# Patient Record
Sex: Female | Born: 1945 | Race: White | Hispanic: No | Marital: Married | Smoking: Never smoker
Health system: Southern US, Community
[De-identification: ages and names within clinical notes are randomized; demographics above are authoritative.]

## PROBLEM LIST (undated history)

## (undated) DIAGNOSIS — E079 Disorder of thyroid, unspecified: Secondary | ICD-10-CM

## (undated) DIAGNOSIS — Z8639 Personal history of other endocrine, nutritional and metabolic disease: Principal | ICD-10-CM

## (undated) DIAGNOSIS — E782 Mixed hyperlipidemia: Secondary | ICD-10-CM

## (undated) DIAGNOSIS — K219 Gastro-esophageal reflux disease without esophagitis: Secondary | ICD-10-CM

## (undated) DIAGNOSIS — E559 Vitamin D deficiency, unspecified: Secondary | ICD-10-CM

## (undated) DIAGNOSIS — Z8619 Personal history of other infectious and parasitic diseases: Secondary | ICD-10-CM

## (undated) HISTORY — DX: Mixed hyperlipidemia: E78.2

## (undated) HISTORY — DX: Disorder of thyroid, unspecified: E07.9

## (undated) HISTORY — DX: Gastro-esophageal reflux disease without esophagitis: K21.9

## (undated) HISTORY — DX: Personal history of other infectious and parasitic diseases: Z86.19

## (undated) HISTORY — PX: THYROIDECTOMY, PARTIAL: SHX18

## (undated) HISTORY — DX: Vitamin D deficiency, unspecified: E55.9

## (undated) HISTORY — DX: Personal history of other endocrine, nutritional and metabolic disease: Z86.39

## (undated) HISTORY — PX: TUBAL LIGATION: SHX77

---

## 1999-09-22 ENCOUNTER — Encounter: Payer: Self-pay | Admitting: Family Medicine

## 1999-09-22 ENCOUNTER — Encounter: Admission: RE | Admit: 1999-09-22 | Discharge: 1999-09-22 | Payer: Self-pay | Admitting: Family Medicine

## 2000-01-19 ENCOUNTER — Encounter: Payer: Self-pay | Admitting: Family Medicine

## 2000-01-19 ENCOUNTER — Encounter: Admission: RE | Admit: 2000-01-19 | Discharge: 2000-01-19 | Payer: Self-pay | Admitting: Family Medicine

## 2000-03-22 ENCOUNTER — Other Ambulatory Visit: Admission: RE | Admit: 2000-03-22 | Discharge: 2000-03-22 | Payer: Self-pay | Admitting: Obstetrics & Gynecology

## 2001-07-30 ENCOUNTER — Other Ambulatory Visit: Admission: RE | Admit: 2001-07-30 | Discharge: 2001-07-30 | Payer: Self-pay | Admitting: Obstetrics & Gynecology

## 2002-11-04 ENCOUNTER — Other Ambulatory Visit: Admission: RE | Admit: 2002-11-04 | Discharge: 2002-11-04 | Payer: Self-pay | Admitting: Obstetrics & Gynecology

## 2003-05-17 ENCOUNTER — Ambulatory Visit (HOSPITAL_COMMUNITY): Admission: RE | Admit: 2003-05-17 | Discharge: 2003-05-17 | Payer: Self-pay | Admitting: Gastroenterology

## 2003-11-08 ENCOUNTER — Other Ambulatory Visit: Admission: RE | Admit: 2003-11-08 | Discharge: 2003-11-08 | Payer: Self-pay | Admitting: Obstetrics & Gynecology

## 2004-12-22 ENCOUNTER — Other Ambulatory Visit: Admission: RE | Admit: 2004-12-22 | Discharge: 2004-12-22 | Payer: Self-pay | Admitting: Obstetrics & Gynecology

## 2005-10-31 ENCOUNTER — Ambulatory Visit: Payer: Self-pay | Admitting: Internal Medicine

## 2005-11-07 ENCOUNTER — Ambulatory Visit: Payer: Self-pay | Admitting: Internal Medicine

## 2006-01-03 ENCOUNTER — Ambulatory Visit: Payer: Self-pay | Admitting: Internal Medicine

## 2006-03-18 ENCOUNTER — Ambulatory Visit: Payer: Self-pay | Admitting: Internal Medicine

## 2007-12-30 ENCOUNTER — Encounter: Payer: Self-pay | Admitting: *Deleted

## 2007-12-30 DIAGNOSIS — E049 Nontoxic goiter, unspecified: Secondary | ICD-10-CM

## 2007-12-30 DIAGNOSIS — K219 Gastro-esophageal reflux disease without esophagitis: Secondary | ICD-10-CM

## 2007-12-30 DIAGNOSIS — G4700 Insomnia, unspecified: Secondary | ICD-10-CM | POA: Insufficient documentation

## 2007-12-30 HISTORY — DX: Nontoxic goiter, unspecified: E04.9

## 2013-11-05 DIAGNOSIS — Z1211 Encounter for screening for malignant neoplasm of colon: Secondary | ICD-10-CM | POA: Diagnosis not present

## 2013-11-05 DIAGNOSIS — Z800 Family history of malignant neoplasm of digestive organs: Secondary | ICD-10-CM | POA: Diagnosis not present

## 2014-07-15 ENCOUNTER — Telehealth: Payer: Self-pay | Admitting: *Deleted

## 2014-07-15 ENCOUNTER — Ambulatory Visit: Payer: Self-pay | Admitting: Family Medicine

## 2014-07-15 DIAGNOSIS — Z0289 Encounter for other administrative examinations: Secondary | ICD-10-CM

## 2014-07-15 NOTE — Telephone Encounter (Signed)
Pt did not show for appointment 07/15/2014 at 1:30pm to establish care

## 2014-12-14 DIAGNOSIS — Z124 Encounter for screening for malignant neoplasm of cervix: Secondary | ICD-10-CM | POA: Diagnosis not present

## 2014-12-14 DIAGNOSIS — Z1231 Encounter for screening mammogram for malignant neoplasm of breast: Secondary | ICD-10-CM | POA: Diagnosis not present

## 2015-08-24 ENCOUNTER — Telehealth: Payer: Self-pay

## 2015-08-24 NOTE — Telephone Encounter (Signed)
Pre Visit Call Completed. 

## 2015-08-29 ENCOUNTER — Encounter: Payer: Self-pay | Admitting: Family Medicine

## 2015-08-29 ENCOUNTER — Ambulatory Visit (INDEPENDENT_AMBULATORY_CARE_PROVIDER_SITE_OTHER): Payer: Medicare Other | Admitting: Family Medicine

## 2015-08-29 VITALS — BP 114/76 | HR 74 | Temp 98.0°F | Ht 67.5 in | Wt 193.3 lb

## 2015-08-29 DIAGNOSIS — E782 Mixed hyperlipidemia: Secondary | ICD-10-CM | POA: Diagnosis not present

## 2015-08-29 DIAGNOSIS — Z8639 Personal history of other endocrine, nutritional and metabolic disease: Secondary | ICD-10-CM

## 2015-08-29 DIAGNOSIS — K219 Gastro-esophageal reflux disease without esophagitis: Secondary | ICD-10-CM | POA: Diagnosis not present

## 2015-08-29 DIAGNOSIS — G4700 Insomnia, unspecified: Secondary | ICD-10-CM | POA: Diagnosis not present

## 2015-08-29 DIAGNOSIS — Z0000 Encounter for general adult medical examination without abnormal findings: Secondary | ICD-10-CM

## 2015-08-29 DIAGNOSIS — Z8619 Personal history of other infectious and parasitic diseases: Secondary | ICD-10-CM | POA: Diagnosis not present

## 2015-08-29 DIAGNOSIS — E559 Vitamin D deficiency, unspecified: Secondary | ICD-10-CM

## 2015-08-29 HISTORY — DX: Personal history of other endocrine, nutritional and metabolic disease: Z86.39

## 2015-08-29 NOTE — Progress Notes (Signed)
Subjective:    Patient ID: Shirley Bean, female    DOB: 10-15-1945, 69 y.o.   MRN: [id number]  Chief Complaint  Patient presents with  . Establish Care    HPI Patient is in today for new patient appointment. She is in today to establish care and feeling fairly well at this time. She does describe herself as having a week immune system and questions whether goes back to her mononucleosis infection years ago. She describes her self is getting colds and sore throats frequently feeling tired frequently. She does not have any concerning symptoms today. She has been having trouble with heartburn although if she watches her diet she only has to take famotidine infrequently. She also has a history of hyperlipidemia, goiter and vitamin D deficiency. Denies CP/palp/SOB/HA/congestion/fevers/GI or GU c/o. Taking meds as prescribed  Past Medical History  Diagnosis Date  . GERD (gastroesophageal reflux disease)   . Thyroid disease     Past Surgical History  Procedure Laterality Date  . Tubal ligation    . Thyroidectomy, partial      Left    Family History  Problem Relation Age of Onset  . Cancer Mother     Colerectal  . Cancer Father     prostate and kidney    Social History   Social History  . Marital Status: Married    Spouse Name: N/A  . Number of Children: N/A  . Years of Education: N/A   Occupational History  . retired    Social History Main Topics  . Smoking status: Never Smoker   . Smokeless tobacco: Not on file  . Alcohol Use: 0.0 oz/week    0 Standard drinks or equivalent per week  . Drug Use: No  . Sexual Activity: Not on file   Other Topics Concern  . Not on file   Social History Narrative    No outpatient prescriptions prior to visit.   No facility-administered medications prior to visit.    Allergies  Allergen Reactions  . Stadol [Butorphanol] Nausea And Vomiting  . Sulfonamide Derivatives     Review of Systems  Constitutional: Positive for  malaise/fatigue. Negative for fever and chills.  HENT: Negative for congestion and hearing loss.   Eyes: Negative for discharge.  Respiratory: Negative for cough, sputum production and shortness of breath.   Cardiovascular: Negative for chest pain, palpitations and leg swelling.  Gastrointestinal: Positive for heartburn. Negative for nausea, vomiting, abdominal pain, diarrhea, constipation and blood in stool.  Genitourinary: Negative for dysuria, urgency, frequency and hematuria.  Musculoskeletal: Positive for myalgias. Negative for back pain and falls.  Skin: Negative for rash.  Neurological: Negative for dizziness, sensory change, loss of consciousness, weakness and headaches.  Endo/Heme/Allergies: Negative for environmental allergies. Does not bruise/bleed easily.  Psychiatric/Behavioral: Negative for depression and suicidal ideas. The patient has insomnia. The patient is not nervous/anxious.        Objective:    Physical Exam  Constitutional: She is oriented to person, place, and time. She appears well-developed and well-nourished. No distress.  HENT:  Head: Normocephalic and atraumatic.  Eyes: Conjunctivae are normal.  Neck: Neck supple. No thyromegaly present.  Cardiovascular: Normal rate, regular rhythm and normal heart sounds.   No murmur heard. Pulmonary/Chest: Effort normal and breath sounds normal. No respiratory distress.  Abdominal: Soft. Bowel sounds are normal. She exhibits no distension and no mass. There is no tenderness.  Musculoskeletal: She exhibits no edema.  Lymphadenopathy:    She has no cervical  adenopathy.  Neurological: She is alert and oriented to person, place, and time.  Skin: Skin is warm and dry.  Psychiatric: She has a normal mood and affect. Her behavior is normal.    BP 114/76 mmHg  Pulse 74  Temp(Src) 98 F (36.7 C) (Oral)  Ht 5' 7.5" (1.715 m)  Wt 193 lb 5 oz (87.686 kg)  BMI 29.81 kg/m2  SpO2 94% Wt Readings from Last 3 Encounters:    08/29/15 193 lb 5 oz (87.686 kg)  03/18/06 204 lb (92.534 kg)     No results found for: WBC, HGB, HCT, PLT, GLUCOSE, CHOL, TRIG, HDL, LDLDIRECT, LDLCALC, ALT, AST, NA, K, CL, CREATININE, BUN, CO2, TSH, PSA, INR, GLUF, HGBA1C, MICROALBUR      Assessment & Plan:   Problem List Items Addressed This Visit    None      I am having Ms. Wille maintain her multivitamin.  Meds ordered this encounter  Medications  . Multiple Vitamin (MULTIVITAMIN) tablet    Sig: Take 1 tablet by mouth daily.     Penni Homans, MD

## 2015-08-29 NOTE — Patient Instructions (Addendum)
[place], liquid, caps and gummies NOW 10 strain probiotic 1 cap daily Zinc 50 mg daily  Preventive Care for Adults, Female A healthy lifestyle and preventive care can promote health and wellness. Preventive health guidelines for women include the following key practices.  A routine yearly physical is a good way to check with your health care provider about your health and preventive screening. It is a chance to share any concerns and updates on your health and to receive a thorough exam.  Visit your dentist for a routine exam and preventive care every 6 months. Brush your teeth twice a day and floss once a day. Good oral hygiene prevents tooth decay and gum disease.  The frequency of eye exams is based on your age, health, family medical history, use of contact lenses, and other factors. Follow your health care provider's recommendations for frequency of eye exams.  Eat a healthy diet. Foods like vegetables, fruits, whole grains, low-fat dairy products, and lean protein foods contain the nutrients you need without too many calories. Decrease your intake of foods high in solid fats, added sugars, and salt. Eat the right amount of calories for you.Get information about a proper diet from your health care provider, if necessary.  Regular physical exercise is one of the most important things you can do for your health. Most adults should get at least 150 minutes of moderate-intensity exercise (any activity that increases your heart rate and causes you to sweat) each week. In addition, most adults need muscle-strengthening exercises on 2 or more days a week.  Maintain a healthy weight. The body mass index (BMI) is a screening tool to identify possible weight problems. It provides an estimate of body fat based on height and weight. Your health care provider can find your BMI and can help you achieve or maintain a healthy weight.For adults 20 years and older:  A BMI below 18.5 is  considered underweight.  A BMI of 18.5 to 24.9 is normal.  A BMI of 25 to 29.9 is considered overweight.  A BMI of 30 and above is considered obese.  Maintain normal blood lipids and cholesterol levels by exercising and minimizing your intake of saturated fat. Eat a balanced diet with plenty of fruit and vegetables. Blood tests for lipids and cholesterol should begin at age 6 and be repeated every 5 years. If your lipid or cholesterol levels are high, you are over 50, or you are at high risk for heart disease, you may need your cholesterol levels checked more frequently.Ongoing high lipid and cholesterol levels should be treated with medicines if diet and exercise are not working.  If you smoke, find out from your health care provider how to quit. If you do not use tobacco, do not start.  Lung cancer screening is recommended for adults aged 41-80 years who are at high risk for developing lung cancer because of a history of smoking. A yearly low-dose CT scan of the lungs is recommended for people who have at least a 30-pack-year history of smoking and are a current smoker or have quit within the past 15 years. A pack year of smoking is smoking an average of 1 pack of cigarettes a day for 1 year (for example: 1 pack a day for 30 years or 2 packs a day for 15 years). Yearly screening should continue until the smoker has stopped smoking for at least 15 years. Yearly screening should be stopped for people who develop a health problem that would  prevent them from having lung cancer treatment.  If you are pregnant, do not drink alcohol. If you are breastfeeding, be very cautious about drinking alcohol. If you are not pregnant and choose to drink alcohol, do not have more than 1 drink per day. One drink is considered to be 12 ounces (355 mL) of beer, 5 ounces (148 mL) of wine, or 1.5 ounces (44 mL) of liquor.  Avoid use of street drugs. Do not share needles with anyone. Ask for help if you need support or  instructions about stopping the use of drugs.  High blood pressure causes heart disease and increases the risk of stroke. Your blood pressure should be checked at least every 1 to 2 years. Ongoing high blood pressure should be treated with medicines if weight loss and exercise do not work.  If you are 17-30 years old, ask your health care provider if you should take aspirin to prevent strokes.  Diabetes screening is done by taking a blood sample to check your blood glucose level after you have not eaten for a certain period of time (fasting). If you are not overweight and you do not have risk factors for diabetes, you should be screened once every 3 years starting at age 83. If you are overweight or obese and you are 28-71 years of age, you should be screened for diabetes every year as part of your cardiovascular risk assessment.  Breast cancer screening is essential preventive care for women. You should practice "breast self-awareness." This means understanding the normal appearance and feel of your breasts and may include breast self-examination. Any changes detected, no matter how small, should be reported to a health care provider. Women in their 10s and 30s should have a clinical breast exam (CBE) by a health care provider as part of a regular health exam every 1 to 3 years. After age 23, women should have a CBE every year. Starting at age 103, women should consider having a mammogram (breast X-ray test) every year. Women who have a family history of breast cancer should talk to their health care provider about genetic screening. Women at a high risk of breast cancer should talk to their health care providers about having an MRI and a mammogram every year.  Breast cancer gene (BRCA)-related cancer risk assessment is recommended for women who have family members with BRCA-related cancers. BRCA-related cancers include breast, ovarian, tubal, and peritoneal cancers. Having family members with these  cancers may be associated with an increased risk for harmful changes (mutations) in the breast cancer genes BRCA1 and BRCA2. Results of the assessment will determine the need for genetic counseling and BRCA1 and BRCA2 testing.  Your health care provider may recommend that you be screened regularly for cancer of the pelvic organs (ovaries, uterus, and vagina). This screening involves a pelvic examination, including checking for microscopic changes to the surface of your cervix (Pap test). You may be encouraged to have this screening done every 3 years, beginning at age 28.  For women ages 41-65, health care providers may recommend pelvic exams and Pap testing every 3 years, or they may recommend the Pap and pelvic exam, combined with testing for human papilloma virus (HPV), every 5 years. Some types of HPV increase your risk of cervical cancer. Testing for HPV may also be done on women of any age with unclear Pap test results.  Other health care providers may not recommend any screening for nonpregnant women who are considered low risk for pelvic cancer  and who do not have symptoms. Ask your health care provider if a screening pelvic exam is right for you.  If you have had past treatment for cervical cancer or a condition that could lead to cancer, you need Pap tests and screening for cancer for at least 20 years after your treatment. If Pap tests have been discontinued, your risk factors (such as having a new sexual partner) need to be reassessed to determine if screening should resume. Some women have medical problems that increase the chance of getting cervical cancer. In these cases, your health care provider may recommend more frequent screening and Pap tests.  Colorectal cancer can be detected and often prevented. Most routine colorectal cancer screening begins at the age of 38 years and continues through age 79 years. However, your health care provider may recommend screening at an earlier age if you  have risk factors for colon cancer. On a yearly basis, your health care provider may provide home test kits to check for hidden blood in the stool. Use of a small camera at the end of a tube, to directly examine the colon (sigmoidoscopy or colonoscopy), can detect the earliest forms of colorectal cancer. Talk to your health care provider about this at age 41, when routine screening begins. Direct exam of the colon should be repeated every 5-10 years through age 45 years, unless early forms of precancerous polyps or small growths are found.  People who are at an increased risk for hepatitis B should be screened for this virus. You are considered at high risk for hepatitis B if:  You were born in a country where hepatitis B occurs often. Talk with your health care provider about which countries are considered high risk.  Your parents were born in a high-risk country and you have not received a shot to protect against hepatitis B (hepatitis B vaccine).  You have HIV or AIDS.  You use needles to inject street drugs.  You live with, or have sex with, someone who has hepatitis B.  You get hemodialysis treatment.  You take certain medicines for conditions like cancer, organ transplantation, and autoimmune conditions.  Hepatitis C blood testing is recommended for all people born from [birth year] through 1965 and any individual with known risks for hepatitis C.  Practice safe sex. Use condoms and avoid high-risk sexual practices to reduce the spread of sexually transmitted infections (STIs). STIs include gonorrhea, chlamydia, syphilis, trichomonas, herpes, HPV, and human immunodeficiency virus (HIV). Herpes, HIV, and HPV are viral illnesses that have no cure. They can result in disability, cancer, and death.  You should be screened for sexually transmitted illnesses (STIs) including gonorrhea and chlamydia if:  You are sexually active and are younger than 24 years.  You are older than 24 years and your  health care provider tells you that you are at risk for this type of infection.  Your sexual activity has changed since you were last screened and you are at an increased risk for chlamydia or gonorrhea. Ask your health care provider if you are at risk.  If you are at risk of being infected with HIV, it is recommended that you take a prescription medicine daily to prevent HIV infection. This is called preexposure prophylaxis (PrEP). You are considered at risk if:  You are sexually active and do not regularly use condoms or know the HIV status of your partner(s).  You take drugs by injection.  You are sexually active with a partner who has HIV.  Talk with your health care provider about whether you are at high risk of being infected with HIV. If you choose to begin PrEP, you should first be tested for HIV. You should then be tested every 3 months for as long as you are taking PrEP.  Osteoporosis is a disease in which the bones lose minerals and strength with aging. This can result in serious bone fractures or breaks. The risk of osteoporosis can be identified using a bone density scan. Women ages 86 years and over and women at risk for fractures or osteoporosis should discuss screening with their health care providers. Ask your health care provider whether you should take a calcium supplement or vitamin D to reduce the rate of osteoporosis.  Menopause can be associated with physical symptoms and risks. Hormone replacement therapy is available to decrease symptoms and risks. You should talk to your health care provider about whether hormone replacement therapy is right for you.  Use sunscreen. Apply sunscreen liberally and repeatedly throughout the day. You should seek shade when your shadow is shorter than you. Protect yourself by wearing long sleeves, pants, a wide-brimmed hat, and sunglasses year round, whenever you are outdoors.  Once a month, do a whole body skin exam, using a mirror to look  at the skin on your back. Tell your health care provider of new moles, moles that have irregular borders, moles that are larger than a pencil eraser, or moles that have changed in shape or color.  Stay current with required vaccines (immunizations).  Influenza vaccine. All adults should be immunized every year.  Tetanus, diphtheria, and acellular pertussis (Td, Tdap) vaccine. Pregnant women should receive 1 dose of Tdap vaccine during each pregnancy. The dose should be obtained regardless of the length of time since the last dose. Immunization is preferred during the 27th-36th week of gestation. An adult who has not previously received Tdap or who does not know her vaccine status should receive 1 dose of Tdap. This initial dose should be followed by tetanus and diphtheria toxoids (Td) booster doses every 10 years. Adults with an unknown or incomplete history of completing a 3-dose immunization series with Td-containing vaccines should begin or complete a primary immunization series including a Tdap dose. Adults should receive a Td booster every 10 years.  Varicella vaccine. An adult without evidence of immunity to varicella should receive 2 doses or a second dose if she has previously received 1 dose. Pregnant females who do not have evidence of immunity should receive the first dose after pregnancy. This first dose should be obtained before leaving the health care facility. The second dose should be obtained 4-8 weeks after the first dose.  Human papillomavirus (HPV) vaccine. Females aged 13-26 years who have not received the vaccine previously should obtain the 3-dose series. The vaccine is not recommended for use in pregnant females. However, pregnancy testing is not needed before receiving a dose. If a female is found to be pregnant after receiving a dose, no treatment is needed. In that case, the remaining doses should be delayed until after the pregnancy. Immunization is recommended for any person  with an immunocompromised condition through the age of 37 years if she did not get any or all doses earlier. During the 3-dose series, the second dose should be obtained 4-8 weeks after the first dose. The third dose should be obtained 24 weeks after the first dose and 16 weeks after the second dose.  Zoster vaccine. One dose is recommended for adults  aged 61 years or older unless certain conditions are present.  Measles, mumps, and rubella (MMR) vaccine. Adults born before [birth year] generally are considered immune to measles and mumps. Adults born in [birth year] or later should have 1 or more doses of MMR vaccine unless there is a contraindication to the vaccine or there is laboratory evidence of immunity to each of the three diseases. A routine second dose of MMR vaccine should be obtained at least 28 days after the first dose for students attending postsecondary schools, health care workers, or international travelers. People who received inactivated measles vaccine or an unknown type of measles vaccine during 1963-1967 should receive 2 doses of MMR vaccine. People who received inactivated mumps vaccine or an unknown type of mumps vaccine before 1979 and are at high risk for mumps infection should consider immunization with 2 doses of MMR vaccine. For females of childbearing age, rubella immunity should be determined. If there is no evidence of immunity, females who are not pregnant should be vaccinated. If there is no evidence of immunity, females who are pregnant should delay immunization until after pregnancy. Unvaccinated health care workers born before [birth year] who lack laboratory evidence of measles, mumps, or rubella immunity or laboratory confirmation of disease should consider measles and mumps immunization with 2 doses of MMR vaccine or rubella immunization with 1 dose of MMR vaccine.  Pneumococcal 13-valent conjugate (PCV13) vaccine. When indicated, a person who is uncertain of his immunization history and has  no record of immunization should receive the PCV13 vaccine. All adults 78 years of age and older should receive this vaccine. An adult aged 103 years or older who has certain medical conditions and has not been previously immunized should receive 1 dose of PCV13 vaccine. This PCV13 should be followed with a dose of pneumococcal polysaccharide (PPSV23) vaccine. Adults who are at high risk for pneumococcal disease should obtain the PPSV23 vaccine at least 8 weeks after the dose of PCV13 vaccine. Adults older than 67 years of age who have normal immune system function should obtain the PPSV23 vaccine dose at least 1 year after the dose of PCV13 vaccine.  Pneumococcal polysaccharide (PPSV23) vaccine. When PCV13 is also indicated, PCV13 should be obtained first. All adults aged 62 years and older should be immunized. An adult younger than age 78 years who has certain medical conditions should be immunized. Any person who resides in a nursing home or long-term care facility should be immunized. An adult smoker should be immunized. People with an immunocompromised condition and certain other conditions should receive both PCV13 and PPSV23 vaccines. People with human immunodeficiency virus (HIV) infection should be immunized as soon as possible after diagnosis. Immunization during chemotherapy or radiation therapy should be avoided. Routine use of PPSV23 vaccine is not recommended for American Indians, [place] Natives, or people younger than 65 years unless there are medical conditions that require PPSV23 vaccine. When indicated, people who have unknown immunization and have no record of immunization should receive PPSV23 vaccine. One-time revaccination 5 years after the first dose of PPSV23 is recommended for people aged 19-64 years who have chronic kidney failure, nephrotic syndrome, asplenia, or immunocompromised conditions. People who received 1-2 doses of PPSV23 before age 39 years should receive another dose of PPSV23  vaccine at age 86 years or later if at least 5 years have passed since the previous dose. Doses of PPSV23 are not needed for people immunized with PPSV23 at or after age 96 years.  Meningococcal vaccine. Adults with asplenia or  persistent complement component deficiencies should receive 2 doses of quadrivalent meningococcal conjugate (MenACWY-D) vaccine. The doses should be obtained at least 2 months apart. Microbiologists working with certain meningococcal bacteria, [place] recruits, people at risk during an outbreak, and people who travel to or live in countries with a high rate of meningitis should be immunized. A first-year college student up through age 34 years who is living in a residence hall should receive a dose if she did not receive a dose on or after her 16th birthday. Adults who have certain high-risk conditions should receive one or more doses of vaccine.  Hepatitis A vaccine. Adults who wish to be protected from this disease, have certain high-risk conditions, work with hepatitis A-infected animals, work in hepatitis A research labs, or travel to or work in countries with a high rate of hepatitis A should be immunized. Adults who were previously unvaccinated and who anticipate close contact with an international adoptee during the first 60 days after arrival in the [place] from a country with a high rate of hepatitis A should be immunized.  Hepatitis B vaccine. Adults who wish to be protected from this disease, have certain high-risk conditions, may be exposed to blood or other infectious body fluids, are household contacts or sex partners of hepatitis B positive people, are clients or workers in certain care facilities, or travel to or work in countries with a high rate of hepatitis B should be immunized.  Haemophilus influenzae type b (Hib) vaccine. A previously unvaccinated person with asplenia or sickle cell disease or having a scheduled splenectomy should receive 1 dose of Hib  vaccine. Regardless of previous immunization, a recipient of a hematopoietic stem cell transplant should receive a 3-dose series 6-12 months after her successful transplant. Hib vaccine is not recommended for adults with HIV infection. Preventive Services / Frequency Ages 35 to 23 years  Blood pressure check.** / Every 3-5 years.  Lipid and cholesterol check.** / Every 5 years beginning at age 65.  Clinical breast exam.** / Every 3 years for women in their 84s and 89s.  BRCA-related cancer risk assessment.** / For women who have family members with a BRCA-related cancer (breast, ovarian, tubal, or peritoneal cancers).  Pap test.** / Every 2 years from ages 40 through 53. Every 3 years starting at age 48 through age 94 or 96 with a history of 3 consecutive normal Pap tests.  HPV screening.** / Every 3 years from ages 73 through ages 7 to 25 with a history of 3 consecutive normal Pap tests.  Hepatitis C blood test.** / For any individual with known risks for hepatitis C.  Skin self-exam. / Monthly.  Influenza vaccine. / Every year.  Tetanus, diphtheria, and acellular pertussis (Tdap, Td) vaccine.** / Consult your health care provider. Pregnant women should receive 1 dose of Tdap vaccine during each pregnancy. 1 dose of Td every 10 years.  Varicella vaccine.** / Consult your health care provider. Pregnant females who do not have evidence of immunity should receive the first dose after pregnancy.  HPV vaccine. / 3 doses over 6 months, if 91 and younger. The vaccine is not recommended for use in pregnant females. However, pregnancy testing is not needed before receiving a dose.  Measles, mumps, rubella (MMR) vaccine.** / You need at least 1 dose of MMR if you were born in 1957 or later. You may also need a 2nd dose. For females of childbearing age, rubella immunity should be determined. If there is no evidence of  immunity, females who are not pregnant should be vaccinated. If there is no  evidence of immunity, females who are pregnant should delay immunization until after pregnancy.  Pneumococcal 13-valent conjugate (PCV13) vaccine.** / Consult your health care provider.  Pneumococcal polysaccharide (PPSV23) vaccine.** / 1 to 2 doses if you smoke cigarettes or if you have certain conditions.  Meningococcal vaccine.** / 1 dose if you are age 8 to 37 years and a Market researcher living in a residence hall, or have one of several medical conditions, you need to get vaccinated against meningococcal disease. You may also need additional booster doses.  Hepatitis A vaccine.** / Consult your health care provider.  Hepatitis B vaccine.** / Consult your health care provider.  Haemophilus influenzae type b (Hib) vaccine.** / Consult your health care provider. Ages 12 to 80 years  Blood pressure check.** / Every year.  Lipid and cholesterol check.** / Every 5 years beginning at age 29 years.  Lung cancer screening. / Every year if you are aged 56-80 years and have a 30-pack-year history of smoking and currently smoke or have quit within the past 15 years. Yearly screening is stopped once you have quit smoking for at least 15 years or develop a health problem that would prevent you from having lung cancer treatment.  Clinical breast exam.** / Every year after age 90 years.  BRCA-related cancer risk assessment.** / For women who have family members with a BRCA-related cancer (breast, ovarian, tubal, or peritoneal cancers).  Mammogram.** / Every year beginning at age 67 years and continuing for as long as you are in good health. Consult with your health care provider.  Pap test.** / Every 3 years starting at age 63 years through age 50 or 69 years with a history of 3 consecutive normal Pap tests.  HPV screening.** / Every 3 years from ages 49 years through ages 51 to 41 years with a history of 3 consecutive normal Pap tests.  Fecal occult blood test (FOBT) of stool. /  Every year beginning at age 52 years and continuing until age 29 years. You may not need to do this test if you get a colonoscopy every 10 years.  Flexible sigmoidoscopy or colonoscopy.** / Every 5 years for a flexible sigmoidoscopy or every 10 years for a colonoscopy beginning at age 45 years and continuing until age 90 years.  Hepatitis C blood test.** / For all people born from [birth year] through 1963 and any individual with known risks for hepatitis C.  Skin self-exam. / Monthly.  Influenza vaccine. / Every year.  Tetanus, diphtheria, and acellular pertussis (Tdap/Td) vaccine.** / Consult your health care provider. Pregnant women should receive 1 dose of Tdap vaccine during each pregnancy. 1 dose of Td every 10 years.  Varicella vaccine.** / Consult your health care provider. Pregnant females who do not have evidence of immunity should receive the first dose after pregnancy.  Zoster vaccine.** / 1 dose for adults aged 4 years or older.  Measles, mumps, rubella (MMR) vaccine.** / You need at least 1 dose of MMR if you were born in 1957 or later. You may also need a second dose. For females of childbearing age, rubella immunity should be determined. If there is no evidence of immunity, females who are not pregnant should be vaccinated. If there is no evidence of immunity, females who are pregnant should delay immunization until after pregnancy.  Pneumococcal 13-valent conjugate (PCV13) vaccine.** / Consult your health care provider.  Pneumococcal polysaccharide (PPSV23) vaccine.** /  1 to 2 doses if you smoke cigarettes or if you have certain conditions.  Meningococcal vaccine.** / Consult your health care provider.  Hepatitis A vaccine.** / Consult your health care provider.  Hepatitis B vaccine.** / Consult your health care provider.  Haemophilus influenzae type b (Hib) vaccine.** / Consult your health care provider. Ages 41 years and over  Blood pressure check.** / Every year.  Lipid  and cholesterol check.** / Every 5 years beginning at age 16 years.  Lung cancer screening. / Every year if you are aged 53-80 years and have a 30-pack-year history of smoking and currently smoke or have quit within the past 15 years. Yearly screening is stopped once you have quit smoking for at least 15 years or develop a health problem that would prevent you from having lung cancer treatment.  Clinical breast exam.** / Every year after age 100 years.  BRCA-related cancer risk assessment.** / For women who have family members with a BRCA-related cancer (breast, ovarian, tubal, or peritoneal cancers).  Mammogram.** / Every year beginning at age 65 years and continuing for as long as you are in good health. Consult with your health care provider.  Pap test.** / Every 3 years starting at age 75 years through age 81 or 65 years with 3 consecutive normal Pap tests. Testing can be stopped between 65 and 70 years with 3 consecutive normal Pap tests and no abnormal Pap or HPV tests in the past 10 years.  HPV screening.** / Every 3 years from ages 53 years through ages 41 or 56 years with a history of 3 consecutive normal Pap tests. Testing can be stopped between 65 and 70 years with 3 consecutive normal Pap tests and no abnormal Pap or HPV tests in the past 10 years.  Fecal occult blood test (FOBT) of stool. / Every year beginning at age 66 years and continuing until age 93 years. You may not need to do this test if you get a colonoscopy every 10 years.  Flexible sigmoidoscopy or colonoscopy.** / Every 5 years for a flexible sigmoidoscopy or every 10 years for a colonoscopy beginning at age 79 years and continuing until age 56 years.  Hepatitis C blood test.** / For all people born from [birth year] through 1963 and any individual with known risks for hepatitis C.  Osteoporosis screening.** / A one-time screening for women ages 46 years and over and women at risk for fractures or osteoporosis.  Skin  self-exam. / Monthly.  Influenza vaccine. / Every year.  Tetanus, diphtheria, and acellular pertussis (Tdap/Td) vaccine.** / 1 dose of Td every 10 years.  Varicella vaccine.** / Consult your health care provider.  Zoster vaccine.** / 1 dose for adults aged 52 years or older.  Pneumococcal 13-valent conjugate (PCV13) vaccine.** / Consult your health care provider.  Pneumococcal polysaccharide (PPSV23) vaccine.** / 1 dose for all adults aged 4 years and older.  Meningococcal vaccine.** / Consult your health care provider.  Hepatitis A vaccine.** / Consult your health care provider.  Hepatitis B vaccine.** / Consult your health care provider.  Haemophilus influenzae type b (Hib) vaccine.** / Consult your health care provider. ** Family history and personal history of risk and conditions may change your health care provider's recommendations.   This information is not intended to replace advice given to you by your health care provider. Make sure you discuss any questions you have with your health care provider.   Document Released: 11/13/2001 Document Revised: 10/08/2014 Document Reviewed: 02/12/2011 Elsevier  Interactive Patient Education [employer].

## 2015-08-29 NOTE — Assessment & Plan Note (Signed)
Will check TSH, freeT4, freeT3

## 2015-08-29 NOTE — Progress Notes (Signed)
Pre visit review using our clinic review tool, if applicable. No additional management support is needed unless otherwise documented below in the visit note. 

## 2015-08-30 ENCOUNTER — Encounter: Payer: Self-pay | Admitting: Family Medicine

## 2015-08-30 LAB — LIPID PANEL
Cholesterol: 219 mg/dL — ABNORMAL HIGH (ref 0–200)
HDL: 72.3 mg/dL (ref >39.00)
LDL Cholesterol: 131 mg/dL — ABNORMAL HIGH (ref 0–99)
NonHDL: 146.93
Total CHOL/HDL Ratio: 3
Triglycerides: 80 mg/dL (ref 0.0–149.0)
VLDL: 16 mg/dL (ref 0.0–40.0)

## 2015-08-30 LAB — COMPREHENSIVE METABOLIC PANEL
ALT: 15 U/L (ref 0–35)
AST: 17 U/L (ref 0–37)
Albumin: 3.9 g/dL (ref 3.5–5.2)
Alkaline Phosphatase: 55 U/L (ref 39–117)
BUN: 18 mg/dL (ref 6–23)
CO2: 30 mEq/L (ref 19–32)
Calcium: 9.3 mg/dL (ref 8.4–10.5)
Chloride: 103 mEq/L (ref 96–112)
Creatinine, Ser: 0.98 mg/dL (ref 0.40–1.20)
GFR: 60.01 mL/min (ref >60.00)
Glucose, Bld: 82 mg/dL (ref 70–99)
Potassium: 3.9 mEq/L (ref 3.5–5.1)
Sodium: 140 mEq/L (ref 135–145)
Total Bilirubin: 0.3 mg/dL (ref 0.2–1.2)
Total Protein: 6.8 g/dL (ref 6.0–8.3)

## 2015-08-30 LAB — CBC
HCT: 41.4 % (ref 36.0–46.0)
Hemoglobin: 13.6 g/dL (ref 12.0–15.0)
MCHC: 32.9 g/dL (ref 30.0–36.0)
MCV: 93.3 fl (ref 78.0–100.0)
Platelets: 229 10*3/uL (ref 150.0–400.0)
RBC: 4.44 Mil/uL (ref 3.87–5.11)
RDW: 13.8 % (ref 11.5–15.5)
WBC: 8.6 10*3/uL (ref 4.0–10.5)

## 2015-08-30 LAB — T3, FREE: T3, Free: 2.8 pg/mL (ref 2.3–4.2)

## 2015-08-30 LAB — TSH: TSH: 1.51 u[IU]/mL (ref 0.35–4.50)

## 2015-08-30 LAB — T4, FREE: Free T4: 0.94 ng/dL (ref 0.60–1.60)

## 2015-08-31 ENCOUNTER — Encounter: Payer: Self-pay | Admitting: Family Medicine

## 2015-08-31 DIAGNOSIS — E782 Mixed hyperlipidemia: Secondary | ICD-10-CM

## 2015-08-31 DIAGNOSIS — E559 Vitamin D deficiency, unspecified: Secondary | ICD-10-CM

## 2015-08-31 HISTORY — DX: Mixed hyperlipidemia: E78.2

## 2015-08-31 HISTORY — DX: Vitamin D deficiency, unspecified: E55.9

## 2015-08-31 NOTE — Assessment & Plan Note (Signed)
Encouraged good sleep hygiene such as dark, quiet room. No blue/green glowing lights such as computer screens in bedroom. No alcohol or stimulants in evening. Cut down on caffeine as able. Regular exercise is helpful but not just prior to bed time.  

## 2015-08-31 NOTE — Assessment & Plan Note (Signed)
Encouraged heart healthy diet, increase exercise, avoid trans fats, consider a krill oil cap daily 

## 2015-08-31 NOTE — Assessment & Plan Note (Signed)
He struggles with chronic fatigue and what she describes as a weak immune system but is not ill at the present time. Encouraged heart healthy diet, good hydration, adequate sleep and exercise

## 2015-08-31 NOTE — Assessment & Plan Note (Addendum)
Avoid offending foods, start probiotics. Do not eat large meals in late evening and consider raising head of bed. Baking soda and Famotidine prn

## 2015-09-02 ENCOUNTER — Encounter: Payer: Self-pay | Admitting: Family Medicine

## 2015-09-05 ENCOUNTER — Encounter: Payer: Self-pay | Admitting: Family Medicine

## 2016-03-15 DIAGNOSIS — H2513 Age-related nuclear cataract, bilateral: Secondary | ICD-10-CM | POA: Diagnosis not present

## 2016-03-15 DIAGNOSIS — H02401 Unspecified ptosis of right eyelid: Secondary | ICD-10-CM | POA: Diagnosis not present

## 2016-05-26 ENCOUNTER — Encounter: Payer: Self-pay | Admitting: Family Medicine

## 2016-07-10 DIAGNOSIS — D1800 Hemangioma unspecified site: Secondary | ICD-10-CM | POA: Diagnosis not present

## 2016-07-10 DIAGNOSIS — D239 Other benign neoplasm of skin, unspecified: Secondary | ICD-10-CM | POA: Diagnosis not present

## 2016-07-10 DIAGNOSIS — L821 Other seborrheic keratosis: Secondary | ICD-10-CM | POA: Diagnosis not present

## 2016-08-27 ENCOUNTER — Ambulatory Visit (INDEPENDENT_AMBULATORY_CARE_PROVIDER_SITE_OTHER): Payer: Medicare Other | Admitting: Family Medicine

## 2016-08-27 VITALS — BP 126/80 | HR 81 | Temp 98.4°F | Resp 17 | Ht 67.5 in | Wt 192.0 lb

## 2016-08-27 DIAGNOSIS — R220 Localized swelling, mass and lump, head: Secondary | ICD-10-CM

## 2016-08-27 DIAGNOSIS — T63441A Toxic effect of venom of bees, accidental (unintentional), initial encounter: Secondary | ICD-10-CM | POA: Diagnosis not present

## 2016-08-27 NOTE — Patient Instructions (Addendum)
At this point, the swelling of your face is likely due to the bee sting, and does not appear to be a systemic issue or allergic reaction. You can continue Benadryl 25 mg 1-2 every 6 hours today, then changed to Zyrtec 10 mg daily tomorrow as long as your improving. He can also take Zantac over-the-counter 75-150 mg twice per day for next 4-5 days.  If you are stung again in the future, and have any rash, hives, or symptoms outside of the area where you were stung, would be seen immediately in emergency room as that would indicate possible allergic reaction.   Bee, Wasp, or [employer], wasps, and hornets are part of a family of insects that can sting people. These stings can cause pain and inflammation, but they are usually not serious. However, some people may have an allergic reaction to a sting. This can cause the symptoms to be more severe.  SYMPTOMS  Common symptoms of this condition include:   A red lump in the skin that sometimes has a tiny hole in the center. In some cases, a stinger may be in the center of the wound.  Pain and itching at the sting site.  Redness and swelling around the sting site. If you have an allergic reaction (localized allergic reaction), the swelling and redness may spread out from the sting site. In some cases, this reaction can continue to develop over the next 12-36 hours. In rare cases, a person may have a severe allergic reaction (anaphylactic reaction) to a sting. Symptoms of an anaphylactic reaction may include:   Wheezing or difficulty breathing.  Raised, itchy, red patches on the skin.  Nausea or vomiting.  Abdominal cramping.  Diarrhea.  Chest pain.  Fainting.  Redness of the face (flushing). DIAGNOSIS  This condition is usually diagnosed based on symptoms, medical history, and a physical exam. TREATMENT  Most stings can be treated with:   Icing to reduce swelling.  Medicines (antihistamines) to treat itching or an allergic  reaction.  Medicines to help reduce pain. These may be medicines that you take by mouth, or medicated creams or lotions that you apply to your skin. If you were stung by a bee, the stinger and a small sac of poison may be in the wound. This may be removed by brushing across it with a flat card, such as a credit card. Another method is to pinch the area and pull it out. These methods can help reduce the severity of the body's reaction to the sting.  HOME CARE INSTRUCTIONS   Wash the sting site daily with soap and water as told by your health care provider.  Apply or take over-the-counter and prescription medicines only as told by your health care provider.  If directed, apply ice to the sting area.  Put ice in a plastic bag.  Place a towel between your skin and the bag.  Leave the ice on for 20 minutes, 2-3 times per day.  Do not scratch the sting area.  To lessen pain, try using a paste that is made of water and baking soda. Rub the paste on the sting area and leave it on for 5 minutes.  If you had a severe allergic reaction to a sting, you may need:  To wear a medical bracelet or necklace that lists the allergy.  To learn when and how to use an anaphylaxis kit or epinephrine injection. Your family members may also need to learn this.  To carry an  anaphylaxis kit with you at all times. SEEK MEDICAL CARE IF:   Your symptoms do not get better in 2-3 days.  You have redness, swelling, or pain that spreads beyond the area of the sting.  You have a fever. SEEK IMMEDIATE MEDICAL CARE IF:  You have symptoms of a severe allergic reaction. These include:   Wheezing or difficulty breathing.  Chest pain.  Light-headedness or fainting.  Itchy, raised, red patches on the skin.  Nausea or vomiting.  Abdominal cramping.  Diarrhea. This information is not intended to replace advice given to you by your health care provider. Make sure you discuss any questions you have with  your health care provider. Document Released: 09/17/2005 Document Revised: 06/08/2015 Document Reviewed: 02/02/2015 Elsevier Interactive Patient Education  2017 [employer].   IF you received an x-ray today, you will receive an invoice from [facility]. Please contact [facility] at [phone] with questions or concerns regarding your invoice.   IF you received labwork today, you will receive an invoice from [employer]. Please contact [facility] at [phone] with questions or concerns regarding your invoice.   Our billing staff will not be able to assist you with questions regarding bills from these companies.  You will be contacted with the lab results as soon as they are available. The fastest way to get your results is to activate your My Chart account. Instructions are located on the last page of this paperwork. If you have not heard from [place] regarding the results in 2 weeks, please contact this office.

## 2016-08-27 NOTE — Progress Notes (Signed)
Subjective:  By signing my name below, I, Essence Howell, attest that this documentation has been prepared under the direction and in the presence of Wendie Agreste, MD Electronically Signed: Ladene Artist, ED Scribe 08/27/2016 at 8:58 AM.   Patient ID: Shirley Bean, female    DOB: 07/31/1946, 70 y.o.   MRN: [id number]  Chief Complaint  Patient presents with  . Insect Bite   HPI HPI Comments: Shirley Bean is a 70 y.o. female who presents to the [facility] complaining of an insect bite to the tip of her nose first noticed around 3 PM 2 days ago. 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 She denies swelling to the nose but reports swelling to her cheeks, lips, sneezing and sinus drainage immediately following the bee sting. She tried 2 Benadryl tablets yesterday and her husband's epinephrine injection yesterday but has not taken Benadryl today. Pt was not evaluated at the hospital following the epinephrine injection. She denies difficulty swallowing, sob, rash or any other stings. No h/o bee sting allergy in the past.  Patient Active Problem List   Diagnosis Date Noted  . Hyperlipidemia, mixed 08/31/2015  . Vitamin D deficiency 08/31/2015  . H/O thyroid nodule 08/29/2015  . History of mononucleosis   . GOITER 12/30/2007  . GERD 12/30/2007  . INSOMNIA 12/30/2007   Past Medical History:  Diagnosis Date  . GERD (gastroesophageal reflux disease)   . H/O thyroid nodule 08/29/2015  . History of mononucleosis   . Hx of scarlet fever    age 21  . Hyperlipidemia, mixed 08/31/2015  . Hyperlipidemia, mixed 08/31/2015  . Thyroid disease   . Vitamin D deficiency 08/31/2015   Past Surgical History:  Procedure Laterality Date  . THYROIDECTOMY, PARTIAL     Left  . TUBAL LIGATION     Allergies  Allergen Reactions  . Stadol [Butorphanol] Nausea And Vomiting  . Sulfonamide Derivatives    Prior to Admission medications     Medication Sig Start Date End Date Taking? Authorizing Provider  Multiple Vitamin (MULTIVITAMIN) tablet Take 1 tablet by mouth daily.   Yes Historical Provider, MD   Social History   Social History  . Marital status: Married    Spouse name: N/A  . Number of children: N/A  . Years of education: N/A   Occupational History  . retired    Social History Main Topics  . Smoking status: Never Smoker  . Smokeless tobacco: Never Used  . Alcohol use 0.0 oz/week  . Drug use: No  . Sexual activity: Yes     Comment: lives with husband, retired from [employer], no dietary restrictions, minimzes meat   Other Topics Concern  . Not on file   Social History Narrative  . No narrative on file   Review of Systems  HENT: Positive for facial swelling and sneezing. Negative for trouble swallowing.   Respiratory: Negative for shortness of breath.   Skin: Positive for wound (tip of nose). Negative for rash.   BP 126/80 (BP Location: Right Arm, Patient Position: Sitting, Cuff Size: Normal)   Pulse 81   Temp 98.4 F (36.9 C) (Oral)   Resp 17   Ht 5' 7.5" (1.715 m)   Wt 192 lb (87.1 kg)   SpO2 97%   BMI 29.63 kg/m     Objective:   Physical Exam  Constitutional: She is oriented to person, place, and  time. She appears well-developed and well-nourished. No distress.  HENT:  Head: Normocephalic and atraumatic.  Soft tissue swelling below the R eye greater than the L. Eyelids are not involved. Nose there is some soft tissue swelling on the distal nose. There is a small wound on the tip of the nose without discharge. Slight soft tissue swelling in the maxillary. No oral lesions. Tongue is not swollen. No difficulty swallowing. Very minimal upper lip swelling.   Eyes: Conjunctivae and EOM are normal.  Neck: Neck supple. No tracheal deviation present.  Cardiovascular: Normal rate.   Pulmonary/Chest: Effort normal. No respiratory distress.  No dyspnea.  Musculoskeletal: Normal range of  motion.  Neurological: She is alert and oriented to person, place, and time.  Skin: Skin is warm and dry.  Psychiatric: She has a normal mood and affect. Her behavior is normal.  Nursing note and vitals reviewed.     Assessment & Plan:   Shirley Bean is a 70 y.o. female Swelling of face  Bee sting, accidental or unintentional, initial encounter  Bee sting on nose with subsequent swelling of face. No hives, no other rash, no diffuse or more distal symptoms, no respiratory symptoms or mucous membrane involvement. Minimal swelling present on exam today,  suspected localized reaction from bee sting. No known history of bee sting allergy, and based on current symptoms does not appear to be a true allergic reaction.  -Continue Benadryl 25 mg 1-2 every 6 hours for first day, then changes her tech. Zantac 75-150 twice a day, and RTC/ER precautions given.  -if future bee sting and any rash/symptoms other than the localized area of staining, advised ER evaluation or 911 if needed. At this point EpiPen was not prescribed as less likely true allergic reaction.  No orders of the defined types were placed in this encounter.  Patient Instructions   At this point, the swelling of your face is likely due to the bee sting, and does not appear to be a systemic issue or allergic reaction. You can continue Benadryl 25 mg 1-2 every 6 hours today, then changed to Zyrtec 10 mg daily tomorrow as long as your improving. He can also take Zantac over-the-counter 75-150 mg twice per day for next 4-5 days.  If you are stung again in the future, and have any rash, hives, or symptoms outside of the area where you were stung, would be seen immediately in emergency room as that would indicate possible allergic reaction.   Bee, Wasp, or [employer], wasps, and hornets are part of a family of insects that can sting people. These stings can cause pain and inflammation, but they are usually not serious. However,  some people may have an allergic reaction to a sting. This can cause the symptoms to be more severe.  SYMPTOMS  Common symptoms of this condition include:   A red lump in the skin that sometimes has a tiny hole in the center. In some cases, a stinger may be in the center of the wound.  Pain and itching at the sting site.  Redness and swelling around the sting site. If you have an allergic reaction (localized allergic reaction), the swelling and redness may spread out from the sting site. In some cases, this reaction can continue to develop over the next 12-36 hours. In rare cases, a person may have a severe allergic reaction (anaphylactic reaction) to a sting. Symptoms of an anaphylactic reaction may include:   Wheezing or difficulty breathing.  Raised,  itchy, red patches on the skin.  Nausea or vomiting.  Abdominal cramping.  Diarrhea.  Chest pain.  Fainting.  Redness of the face (flushing). DIAGNOSIS  This condition is usually diagnosed based on symptoms, medical history, and a physical exam. TREATMENT  Most stings can be treated with:   Icing to reduce swelling.  Medicines (antihistamines) to treat itching or an allergic reaction.  Medicines to help reduce pain. These may be medicines that you take by mouth, or medicated creams or lotions that you apply to your skin. If you were stung by a bee, the stinger and a small sac of poison may be in the wound. This may be removed by brushing across it with a flat card, such as a credit card. Another method is to pinch the area and pull it out. These methods can help reduce the severity of the body's reaction to the sting.  HOME CARE INSTRUCTIONS   Wash the sting site daily with soap and water as told by your health care provider.  Apply or take over-the-counter and prescription medicines only as told by your health care provider.  If directed, apply ice to the sting area.  Put ice in a plastic bag.  Place a towel between  your skin and the bag.  Leave the ice on for 20 minutes, 2-3 times per day.  Do not scratch the sting area.  To lessen pain, try using a paste that is made of water and baking soda. Rub the paste on the sting area and leave it on for 5 minutes.  If you had a severe allergic reaction to a sting, you may need:  To wear a medical bracelet or necklace that lists the allergy.  To learn when and how to use an anaphylaxis kit or epinephrine injection. Your family members may also need to learn this.  To carry an anaphylaxis kit with you at all times. SEEK MEDICAL CARE IF:   Your symptoms do not get better in 2-3 days.  You have redness, swelling, or pain that spreads beyond the area of the sting.  You have a fever. SEEK IMMEDIATE MEDICAL CARE IF:  You have symptoms of a severe allergic reaction. These include:   Wheezing or difficulty breathing.  Chest pain.  Light-headedness or fainting.  Itchy, raised, red patches on the skin.  Nausea or vomiting.  Abdominal cramping.  Diarrhea. This information is not intended to replace advice given to you by your health care provider. Make sure you discuss any questions you have with your health care provider. Document Released: 09/17/2005 Document Revised: 06/08/2015 Document Reviewed: 02/02/2015 Elsevier Interactive Patient Education  2017 [employer].   IF you received an x-ray today, you will receive an invoice from [facility]. Please contact [facility] at [phone] with questions or concerns regarding your invoice.   IF you received labwork today, you will receive an invoice from [employer]. Please contact [facility] at [phone] with questions or concerns regarding your invoice.   Our billing staff will not be able to assist you with questions regarding bills from these companies.  You will be contacted with the lab results as soon as they are available. The fastest way to  get your results is to activate your My Chart account. Instructions are located on the last page of this paperwork. If you have not heard from [place] regarding the results in 2 weeks, please contact this office.       I personally performed the services  described in this documentation, which was scribed in my presence. The recorded information has been reviewed and considered, and addended by me as needed.   Signed,   Merri Ray, MD [facility].  08/28/16 8:14 AM

## 2016-12-05 ENCOUNTER — Telehealth: Payer: Self-pay | Admitting: *Deleted

## 2016-12-05 NOTE — Telephone Encounter (Signed)
Called patient and left message to return call to schedule AWV w/ Health Coach and follow-up w/ PCP.

## 2016-12-25 ENCOUNTER — Ambulatory Visit (INDEPENDENT_AMBULATORY_CARE_PROVIDER_SITE_OTHER): Payer: Medicare Other

## 2016-12-25 ENCOUNTER — Ambulatory Visit (INDEPENDENT_AMBULATORY_CARE_PROVIDER_SITE_OTHER): Payer: Medicare Other | Admitting: Family Medicine

## 2016-12-25 VITALS — BP 146/75 | HR 74 | Temp 98.2°F | Resp 16 | Ht 67.5 in | Wt 191.2 lb

## 2016-12-25 DIAGNOSIS — R110 Nausea: Secondary | ICD-10-CM

## 2016-12-25 DIAGNOSIS — R011 Cardiac murmur, unspecified: Secondary | ICD-10-CM

## 2016-12-25 DIAGNOSIS — R5383 Other fatigue: Secondary | ICD-10-CM | POA: Diagnosis not present

## 2016-12-25 DIAGNOSIS — Z13220 Encounter for screening for lipoid disorders: Secondary | ICD-10-CM | POA: Diagnosis not present

## 2016-12-25 DIAGNOSIS — R42 Dizziness and giddiness: Secondary | ICD-10-CM

## 2016-12-25 DIAGNOSIS — R55 Syncope and collapse: Secondary | ICD-10-CM

## 2016-12-25 DIAGNOSIS — Z8639 Personal history of other endocrine, nutritional and metabolic disease: Secondary | ICD-10-CM | POA: Diagnosis not present

## 2016-12-25 DIAGNOSIS — R079 Chest pain, unspecified: Secondary | ICD-10-CM | POA: Diagnosis not present

## 2016-12-25 LAB — GLUCOSE, POCT (MANUAL RESULT ENTRY): POC Glucose: 99 mg/dl (ref 70–99)

## 2016-12-25 NOTE — Patient Instructions (Addendum)
EKG without acute findings today. I will check a heart enzyme tests tonight, and if that is elevated, we'll call you so you can be seen in the emergency room. I did refer you to cardiology to discuss the chest pain further, but if you do have a return of symptoms that you had on Sunday, or return of chest pains or tightness, I would recommend you be seen in the emergency room or call 911. Continue aspirin 81 mg every day for now.  Cardiology can also evaluate you for the heart murmur, and suspect you will likely have a stress test and echocardiogram.  Return to the clinic or go to the nearest emergency room if any of your symptoms worsen or new symptoms occur.   Nonspecific Chest Pain Chest pain can be caused by many different conditions. There is always a chance that your pain could be related to something serious, such as a heart attack or a blood clot in your lungs. Chest pain can also be caused by conditions that are not life-threatening. If you have chest pain, it is very important to follow up with your health care provider. What are the causes? Causes of this condition include:  Heartburn.  Pneumonia or bronchitis.  Anxiety or stress.  Inflammation around your heart (pericarditis) or lung (pleuritis or pleurisy).  A blood clot in your lung.  A collapsed lung (pneumothorax). This can develop suddenly on its own (spontaneous pneumothorax) or from trauma to the chest.  Shingles infection (varicella-zoster virus).  Heart attack.  Damage to the bones, muscles, and cartilage that make up your chest wall. This can include:  Bruised bones due to injury.  Strained muscles or cartilage due to frequent or repeated coughing or overwork.  Fracture to one or more ribs.  Sore cartilage due to inflammation (costochondritis). What increases the risk? Risk factors for this condition may include:  Activities that increase your risk for trauma or injury to your chest.  Respiratory  infections or conditions that cause frequent coughing.  Medical conditions or overeating that can cause heartburn.  Heart disease or family history of heart disease.  Conditions or health behaviors that increase your risk of developing a blood clot.  Having had chicken pox (varicella zoster). What are the signs or symptoms? Chest pain can feel like:  Burning or tingling on the surface of your chest or deep in your chest.  Crushing, pressure, aching, or squeezing pain.  Dull or sharp pain that is worse when you move, cough, or take a deep breath.  Pain that is also felt in your back, neck, shoulder, or arm, or pain that spreads to any of these areas. Your chest pain may come and go, or it may stay constant. How is this diagnosed? Lab tests or other studies may be needed to find the cause of your pain. Your health care provider may have you take a test called an ECG (electrocardiogram). An ECG records your heartbeat patterns at the time the test is performed. You may also have other tests, such as:  Transthoracic echocardiogram (TTE). In this test, sound waves are used to create a picture of the heart structures and to look at how blood flows through your heart.  Transesophageal echocardiogram (TEE).This is a more advanced imaging test that takes images from inside your body. It allows your health care provider to see your heart in finer detail.  Cardiac monitoring. This allows your health care provider to monitor your heart rate and rhythm in real time.  Holter monitor. This is a portable device that records your heartbeat and can help to diagnose abnormal heartbeats. It allows your health care provider to track your heart activity for several days, if needed.  Stress tests. These can be done through exercise or by taking medicine that makes your heart beat more quickly.  Blood tests.  Other imaging tests. How is this treated? Treatment depends on what is causing your chest pain.  Treatment may include:  Medicines. These may include:  Acid blockers for heartburn.  Anti-inflammatory medicine.  Pain medicine for inflammatory conditions.  Antibiotic medicine, if an infection is present.  Medicines to dissolve blood clots.  Medicines to treat coronary artery disease (CAD).  Supportive care for conditions that do not require medicines. This may include:  Resting.  Applying heat or cold packs to injured areas.  Limiting activities until pain decreases. Follow these instructions at home: Medicines   If you were prescribed an antibiotic, take it as told by your health care provider. Do not stop taking the antibiotic even if you start to feel better.  Take over-the-counter and prescription medicines only as told by your health care provider. Lifestyle   Do not use any products that contain nicotine or tobacco, such as cigarettes and e-cigarettes. If you need help quitting, ask your health care provider.  Do not drink alcohol.  Make lifestyle changes as directed by your health care provider. These may include:  Getting regular exercise. Ask your health care provider to suggest some activities that are safe for you.  Eating a heart-healthy diet. A registered dietitian can help you to learn healthy eating options.  Maintaining a healthy weight.  Managing diabetes, if necessary.  Reducing stress, such as with yoga or relaxation techniques. General instructions   Avoid any activities that bring on chest pain.  If heartburn is the cause for your chest pain, raise (elevate) the head of your bed about 6 inches (15 cm) by putting blocks under the legs. Sleeping with more pillows does not effectively relieve heartburn because it only changes the position of your head.  Keep all follow-up visits as told by your health care provider. This is important. This includes any further testing if your chest pain does not go away. Contact a health care provider  if:  Your chest pain does not go away.  You have a rash with blisters on your chest.  You have a fever.  You have chills. Get help right away if:  Your chest pain is worse.  You have a cough that gets worse, or you cough up blood.  You have severe pain in your abdomen.  You have severe weakness.  You faint.  You have sudden, unexplained chest discomfort.  You have sudden, unexplained discomfort in your arms, back, neck, or jaw.  You have shortness of breath at any time.  You suddenly start to sweat, or your skin gets clammy.  You feel nauseous or you vomit.  You suddenly feel light-headed or dizzy.  Your heart begins to beat quickly, or it feels like it is skipping beats. These symptoms may represent a serious problem that is an emergency. Do not wait to see if the symptoms will go away. Get medical help right away. Call your local emergency services (911 in the [place] Do not drive yourself to the hospital. This information is not intended to replace advice given to you by your health care provider. Make sure you discuss any questions you have with your health care  provider. Document Released: 06/27/2005 Document Revised: 06/11/2016 Document Reviewed: 06/11/2016 Elsevier Interactive Patient Education  2017 [employer] A heart murmur is an extra sound that is caused by chaotic blood flow. The murmur can be heard as a "hum" or "whoosh" sound when blood flows through the heart. The heart has four areas called chambers. Valves separate the upper and lower chambers from each other (tricuspid valve and mitral valve) and separate the lower chambers of the heart from pathways that lead away from the heart (aortic valve and pulmonary valve). Normally, the valves open to let blood flow through or out of your heart, and then they shut to keep the blood from flowing backward. There are two types of heart murmurs:  Innocent murmurs. Most people with this type of  heart murmur do not have a heart problem. Many children have innocent heart murmurs. Your health care provider may suggest some basic testing to find out whether your murmur is an innocent murmur. If an innocent heart murmur is found, there is no need for further tests or treatment and no need to restrict activities or stop playing sports.  Abnormal murmurs. These types of murmurs can occur in children and adults. Abnormal murmurs may be a sign of a more serious heart condition, such as a heart defect present at birth (congenital defect) or heart valve disease. What are the causes? This condition is caused by heart valves that are not working properly. In children, abnormal heart murmurs are typically caused by congenital defects. In adults, abnormal murmurs are usually from heart valve problems caused by disease, infection, or aging. Three types of heart valve defects can cause a murmur:  Regurgitation. This is when blood leaks back through the valve in the wrong direction.  Mitral valve prolapse. This is when the mitral valve of the heart has a loose flap and does not close tightly.  Stenosis. This is when a valve does not open enough and blocks blood flow. This condition may also be caused by:  Pregnancy.  Fever.  Overactive thyroid gland.  Anemia.  Exercise.  Rapid growth spurts (in children). What are the signs or symptoms? Innocent murmurs do not cause symptoms, and many people with abnormal murmurs may or may not have symptoms. If symptoms do develop, they may include:  Shortness of breath.  Blue coloring of the skin, especially on the fingertips.  Chest pain.  Palpitations, or feeling a fluttering or skipped heartbeat.  Fainting.  Persistent cough.  Getting tired much faster than expected.  Swelling in the abdomen, feet, or ankles. How is this diagnosed? This condition may be diagnosed during a routine physical or other exam. If your health care provider hears a  murmur with a stethoscope, he or she will listen for:  Where the murmur is located in your heart.  How long the murmur lasts (duration).  When the murmur is heard during the heartbeat.  How loud the murmur is. This may help the health care provider figure out what is causing the murmur. You may be referred to a heart specialist (cardiologist). You may also have other tests, including:  Electrocardiogram (ECG or EKG). This test measures the electrical activity of your heart.  Echocardiogram. This test uses high frequency sound waves to make pictures of your heart.  MRI or chest X-ray.  Cardiac catheterization. This test looks at blood flow through the heart. For children and adults who have an abnormal heart murmur and want to stay active, it  is important to complete testing, review test results, and receive recommendations from your health care provider. If heart disease is present, it may not be safe to play or be active. How is this treated? Heart murmurs themselves do not need treatment. In some cases, a heart murmur may go away on its own. If an underlying problem or disease is causing the murmur, you may need treatment. If treatment is needed, it will depend on the type and severity of the disease or heart problem causing the murmur. Treatment may include:  Medicine.  Surgery.  Dietary and lifestyle changes. Follow these instructions at home:  Talk with your health care provider before participating in sports or other activities that require a lot of effort and energy (are strenuous).  Learn as much as possible about your condition and any related diseases. Ask your health care provider if you may at risk for any medical emergencies.  Talk with your health care provider about what symptoms you should look out for.  It is up to you to get your test results. Ask your health care provider, or the department that is doing the test, when your results will be ready.  Keep all  follow-up visits as told by your health care provider. This is important. Contact a health care provider if:  You feel light-headed.  You are frequently short of breath.  You feel more tired than usual.  You are having a hard time keeping up with normal activities or fitness routines.  You have swelling in your ankles or feet.  You have chest pain.  You notice that your heart often beats irregularly.  You develop any new symptoms. Get help right away if:  You develop severe chest pain.  You are having trouble breathing.  You have fainting spells.  Your symptoms suddenly get worse. These symptoms may represent a serious problem that is an emergency. Do not wait to see if the symptoms will go away. Get medical help right away. Call your local emergency services (911 in the [place] Do not drive yourself to the hospital. Summary  Normally, the heart valves open to let blood flow through or out of your heart, and then they shut to keep the blood from flowing backward.  Heart murmur is caused by heart valves that are not working properly.  You may need treatment if an underlying problem or disease is causing the heart murmur. Treatment may include medicine, surgery, or dietary and lifestyle changes.  Talk with your health care provider before participating in sports or other activities that require a lot of effort and energy (are strenuous).  Talk with your health care provider about what symptoms you should watch out for. This information is not intended to replace advice given to you by your health care provider. Make sure you discuss any questions you have with your health care provider. Document Released: 10/25/2004 Document Revised: 09/05/2016 Document Reviewed: 09/05/2016 Elsevier Interactive Patient Education  2017 [employer].   IF you received an x-ray today, you will receive an invoice from [facility]. Please contact [facility] at [phone] with  questions or concerns regarding your invoice.   IF you received labwork today, you will receive an invoice from [facility]. Please contact [facility] at [phone] with questions or concerns regarding your invoice.   Our billing staff will not be able to assist you with questions regarding bills from these companies.  You will be contacted with the lab results as soon as they are available. The  fastest way to get your results is to activate your My Chart account. Instructions are located on the last page of this paperwork. If you have not heard from [place] regarding the results in 2 weeks, please contact this office.

## 2016-12-25 NOTE — Progress Notes (Signed)
Subjective:  By signing my name below, I, Essence Howell, attest that this documentation has been prepared under the direction and in the presence of Wendie Agreste, MD Electronically Signed: Ladene Artist, ED Scribe 12/25/2016 at 4:30 PM.   Patient ID: Monico Blitz, female    DOB: 10-05-1945, 71 y.o.   MRN: [id number]  Chief Complaint  Patient presents with  . Heart Problem    Pt states she has been having heart issues for years and would like to get a baseline ECG today.   . Other    Would like hardcopy prescription if prescribed anything   HPI ELIJAH MICHAELIS is a 71 y.o. female who presents to Primary Care at [facility] complaining of intermittent chest pain and palpitations for the past 23 years that returned yesterday. Pt reports left lower chest pain yesterday and central chest pain today that she describes as a "constricting" sensation. States it feels like "someone has a band around her chest". She reports associated symptoms of chest tightness, light-headedness, dizziness, nausea, photophobia, HA, extreme fatigue 2 nights ago and diaphoresis 2 days ago. She has taken 2 aspirin 2 days ago. She also reports an episode of jaw tightening with walking throughout the woods in 2014. Pt denies fever, recent heart palpitations, sob, slurred speech, vomiting, heartburn, falls, difficulty moving extremities. Her mother had a pacemaker. She has seen [facility] ~10 years ago and really loved working with Dr. Daneen Schick. No recent EKG. She has been evaluated by her acupuncturist as well. Pt had her goiter removed; normal TSH in 2014. She denies tobacco use at this time; she was an occasional smoker in the past.  Patient Active Problem List   Diagnosis Date Noted  . Hyperlipidemia, mixed 08/31/2015  . Vitamin D deficiency 08/31/2015  . H/O thyroid nodule 08/29/2015  . History of mononucleosis   . GOITER 12/30/2007  . GERD 12/30/2007  . INSOMNIA 12/30/2007   Past Medical History:    Diagnosis Date  . GERD (gastroesophageal reflux disease)   . H/O thyroid nodule 08/29/2015  . History of mononucleosis   . Hx of scarlet fever    age 21  . Hyperlipidemia, mixed 08/31/2015  . Hyperlipidemia, mixed 08/31/2015  . Thyroid disease   . Vitamin D deficiency 08/31/2015   Past Surgical History:  Procedure Laterality Date  . THYROIDECTOMY, PARTIAL     Left  . TUBAL LIGATION     Allergies  Allergen Reactions  . Stadol [Butorphanol] Nausea And Vomiting  . Sulfonamide Derivatives    Prior to Admission medications   Medication Sig Start Date End Date Taking? Authorizing Provider  Multiple Vitamin (MULTIVITAMIN) tablet Take 1 tablet by mouth daily.   Yes Historical Provider, MD   Social History   Social History  . Marital status: Married    Spouse name: N/A  . Number of children: N/A  . Years of education: N/A   Occupational History  . retired    Social History Main Topics  . Smoking status: Never Smoker  . Smokeless tobacco: Never Used  . Alcohol use 0.0 oz/week  . Drug use: No  . Sexual activity: Yes     Comment: lives with husband, retired from [employer], no dietary restrictions, minimzes meat   Other Topics Concern  . Not on file   Social History Narrative  . No narrative on file   Review of Systems  Constitutional: Positive for diaphoresis (resolved) and fatigue. Negative for fever.  Eyes: Positive for  photophobia.  Respiratory: Negative for shortness of breath.   Cardiovascular: Positive for chest pain. Negative for palpitations.  Gastrointestinal: Positive for nausea. Negative for vomiting.  Neurological: Positive for dizziness, light-headedness and headaches. Negative for speech difficulty.      Objective:   Physical Exam  Constitutional: She is oriented to person, place, and time. She appears well-developed and well-nourished.  HENT:  Head: Normocephalic and atraumatic.  Eyes: Conjunctivae and EOM are normal. Pupils are equal,  round, and reactive to light.  Neck: Carotid bruit is not present.  Cardiovascular: Normal rate, regular rhythm and intact distal pulses.   Murmur heard.  Systolic murmur is present with a grade of 2/6  Pulmonary/Chest: Effort normal and breath sounds normal.  Lungs are clear to auscultation.  Abdominal: Soft. She exhibits no pulsatile midline mass. There is no tenderness.  Neurological: She is alert and oriented to person, place, and time.  Skin: Skin is warm and dry.  Psychiatric: She has a normal mood and affect. Her behavior is normal.  Vitals reviewed.  Vitals:   12/25/16 1531  BP: (!) 146/75  Pulse: 74  Resp: 16  Temp: 98.2 F (36.8 C)  TempSrc: Oral  SpO2: 98%  Weight: 191 lb 3.2 oz (86.7 kg)  Height: 5' 7.5" (1.715 m)   Dg Chest 2 View  Result Date: 12/25/2016 CLINICAL DATA:  Heart murmur. EXAM: CHEST  2 VIEW COMPARISON:  None. FINDINGS: Cardiomediastinal silhouette is normal. Mediastinal contours appear intact. Tortuosity of the aorta. There is no evidence of focal airspace consolidation, pleural effusion or pneumothorax. Osseous structures are without acute abnormality. Soft tissues are grossly normal. IMPRESSION: No active pulmonary disease. Tortuosity of the aorta. Electronically Signed   By: Fidela Salisbury M.D.   On: 12/25/2016 17:05   EKG reading done by Wendie Agreste, MD: Sinus rhythm rast 69. RSR' in V1. No acute findings.   Over 40 minutes of care provided with evaluation, XR, discussion of care in office and stat labs.   Results for orders placed or performed in visit on 12/25/16  CBC  Result Value Ref Range   WBC 6.6 3.4 - 10.8 x10E3/uL   RBC 4.34 3.77 - 5.28 x10E6/uL   Hemoglobin 13.5 11.1 - 15.9 g/dL   Hematocrit 40.9 34.0 - 46.6 %   MCV 94 79 - 97 fL   MCH 31.1 26.6 - 33.0 pg   MCHC 33.0 31.5 - 35.7 g/dL   RDW 14.1 12.3 - 15.4 %   Platelets 265 150 - 379 x10E3/uL  TSH  Result Value Ref Range   TSH 2.090 0.450 - 4.500 uIU/mL  Troponin I    Result Value Ref Range   Troponin I <0.01 0.00 - 0.04 ng/mL  Lipid panel  Result Value Ref Range   Cholesterol, Total 230 (H) 100 - 199 mg/dL   Triglycerides 162 (H) 0 - 149 mg/dL   HDL 71 >39 mg/dL   VLDL Cholesterol Cal 32 5 - 40 mg/dL   LDL Calculated 127 (H) 0 - 99 mg/dL   Chol/HDL Ratio 3.2 0.0 - 4.4 ratio units  Comprehensive metabolic panel  Result Value Ref Range   Glucose 91 65 - 99 mg/dL   BUN 14 8 - 27 mg/dL   Creatinine, Ser 0.84 0.57 - 1.00 mg/dL   GFR calc non Af Amer 71 >59 mL/min/1.73   GFR calc Af Amer 82 >59 mL/min/1.73   BUN/Creatinine Ratio 17 12 - 28   Sodium 142 134 - 144 mmol/L  Potassium 4.5 3.5 - 5.2 mmol/L   Chloride 104 96 - 106 mmol/L   CO2 22 18 - 29 mmol/L   Calcium 9.2 8.7 - 10.3 mg/dL   Total Protein 6.7 6.0 - 8.5 g/dL   Albumin 4.2 3.6 - 4.8 g/dL   Globulin, Total 2.5 1.5 - 4.5 g/dL   Albumin/Globulin Ratio 1.7 1.2 - 2.2   Bilirubin Total 0.2 0.0 - 1.2 mg/dL   Alkaline Phosphatase 54 39 - 117 IU/L   AST 20 0 - 40 IU/L   ALT 16 0 - 32 IU/L  POCT glucose (manual entry)  Result Value Ref Range   POC Glucose 99 70 - 99 mg/dl       Assessment & Plan:   MARIEELENA BARTKO is a 71 y.o. female Chest pain, unspecified type - Plan: EKG 12-Lead, Troponin I, Ambulatory referral to Cardiology Other fatigue - Plan: EKG 12-Lead, POCT glucose (manual entry), TSH, Comprehensive metabolic panel Nausea without vomiting - Plan: Comprehensive metabolic panel Dizziness - Plan: CBC, POCT glucose (manual entry), TSH, Comprehensive metabolic panel, Ambulatory referral to Cardiology Near syncope  - asymptomatic in office, but chest symptoms as well as near syncope 2 days prior. Pain nonexertional.   -EKG without apparent acute finding, CXR reassuring, troponin negative as above, which is somewhat reassuring given symptoms from day prior and morning of visit.   -glucose, other electrolytes reassuring.   - denies recent heartburn, but is treating/preventing with  papaya.   -will refer to cardiology for eval. Avoid exertion for now, continue ASA 81mg  QD and ER/911 chest pain precautions reviewed.   History of goiter - Plan: TSH  - tsh reassuring  Heart murmur - Plan: DG Chest 2 View, Ambulatory referral to Cardiology  - refer to cardiology.  No sign of fluid overload on exam or CXR.  Likely will need echo as part of workup.   Screening for hyperlipidemia - Plan: Lipid panel  - lipids as above. HDL at a good level, but 10 year ASCVD risk calculated at 10.8%.  Could look at diet as initial approach, but may also consider statin sooner with above concerns. Can be discussed with cardiology.   ER/RTC precautions discussed prior to eval with cardiology.    No orders of the defined types were placed in this encounter.  Patient Instructions    EKG without acute findings today. I will check a heart enzyme tests tonight, and if that is elevated, we'll call you so you can be seen in the emergency room. I did refer you to cardiology to discuss the chest pain further, but if you do have a return of symptoms that you had on Sunday, or return of chest pains or tightness, I would recommend you be seen in the emergency room or call 911. Continue aspirin 81 mg every day for now.  Cardiology can also evaluate you for the heart murmur, and suspect you will likely have a stress test and echocardiogram.  Return to the clinic or go to the nearest emergency room if any of your symptoms worsen or new symptoms occur.   Nonspecific Chest Pain Chest pain can be caused by many different conditions. There is always a chance that your pain could be related to something serious, such as a heart attack or a blood clot in your lungs. Chest pain can also be caused by conditions that are not life-threatening. If you have chest pain, it is very important to follow up with your health care provider. What are  the causes? Causes of this condition include:  Heartburn.  Pneumonia or  bronchitis.  Anxiety or stress.  Inflammation around your heart (pericarditis) or lung (pleuritis or pleurisy).  A blood clot in your lung.  A collapsed lung (pneumothorax). This can develop suddenly on its own (spontaneous pneumothorax) or from trauma to the chest.  Shingles infection (varicella-zoster virus).  Heart attack.  Damage to the bones, muscles, and cartilage that make up your chest wall. This can include:  Bruised bones due to injury.  Strained muscles or cartilage due to frequent or repeated coughing or overwork.  Fracture to one or more ribs.  Sore cartilage due to inflammation (costochondritis). What increases the risk? Risk factors for this condition may include:  Activities that increase your risk for trauma or injury to your chest.  Respiratory infections or conditions that cause frequent coughing.  Medical conditions or overeating that can cause heartburn.  Heart disease or family history of heart disease.  Conditions or health behaviors that increase your risk of developing a blood clot.  Having had chicken pox (varicella zoster). What are the signs or symptoms? Chest pain can feel like:  Burning or tingling on the surface of your chest or deep in your chest.  Crushing, pressure, aching, or squeezing pain.  Dull or sharp pain that is worse when you move, cough, or take a deep breath.  Pain that is also felt in your back, neck, shoulder, or arm, or pain that spreads to any of these areas. Your chest pain may come and go, or it may stay constant. How is this diagnosed? Lab tests or other studies may be needed to find the cause of your pain. Your health care provider may have you take a test called an ECG (electrocardiogram). An ECG records your heartbeat patterns at the time the test is performed. You may also have other tests, such as:  Transthoracic echocardiogram (TTE). In this test, sound waves are used to create a picture of the heart  structures and to look at how blood flows through your heart.  Transesophageal echocardiogram (TEE).This is a more advanced imaging test that takes images from inside your body. It allows your health care provider to see your heart in finer detail.  Cardiac monitoring. This allows your health care provider to monitor your heart rate and rhythm in real time.  Holter monitor. This is a portable device that records your heartbeat and can help to diagnose abnormal heartbeats. It allows your health care provider to track your heart activity for several days, if needed.  Stress tests. These can be done through exercise or by taking medicine that makes your heart beat more quickly.  Blood tests.  Other imaging tests. How is this treated? Treatment depends on what is causing your chest pain. Treatment may include:  Medicines. These may include:  Acid blockers for heartburn.  Anti-inflammatory medicine.  Pain medicine for inflammatory conditions.  Antibiotic medicine, if an infection is present.  Medicines to dissolve blood clots.  Medicines to treat coronary artery disease (CAD).  Supportive care for conditions that do not require medicines. This may include:  Resting.  Applying heat or cold packs to injured areas.  Limiting activities until pain decreases. Follow these instructions at home: Medicines   If you were prescribed an antibiotic, take it as told by your health care provider. Do not stop taking the antibiotic even if you start to feel better.  Take over-the-counter and prescription medicines only as told by your health  care provider. Lifestyle   Do not use any products that contain nicotine or tobacco, such as cigarettes and e-cigarettes. If you need help quitting, ask your health care provider.  Do not drink alcohol.  Make lifestyle changes as directed by your health care provider. These may include:  Getting regular exercise. Ask your health care provider to  suggest some activities that are safe for you.  Eating a heart-healthy diet. A registered dietitian can help you to learn healthy eating options.  Maintaining a healthy weight.  Managing diabetes, if necessary.  Reducing stress, such as with yoga or relaxation techniques. General instructions   Avoid any activities that bring on chest pain.  If heartburn is the cause for your chest pain, raise (elevate) the head of your bed about 6 inches (15 cm) by putting blocks under the legs. Sleeping with more pillows does not effectively relieve heartburn because it only changes the position of your head.  Keep all follow-up visits as told by your health care provider. This is important. This includes any further testing if your chest pain does not go away. Contact a health care provider if:  Your chest pain does not go away.  You have a rash with blisters on your chest.  You have a fever.  You have chills. Get help right away if:  Your chest pain is worse.  You have a cough that gets worse, or you cough up blood.  You have severe pain in your abdomen.  You have severe weakness.  You faint.  You have sudden, unexplained chest discomfort.  You have sudden, unexplained discomfort in your arms, back, neck, or jaw.  You have shortness of breath at any time.  You suddenly start to sweat, or your skin gets clammy.  You feel nauseous or you vomit.  You suddenly feel light-headed or dizzy.  Your heart begins to beat quickly, or it feels like it is skipping beats. These symptoms may represent a serious problem that is an emergency. Do not wait to see if the symptoms will go away. Get medical help right away. Call your local emergency services (911 in the [place] Do not drive yourself to the hospital. This information is not intended to replace advice given to you by your health care provider. Make sure you discuss any questions you have with your health care provider. Document  Released: 06/27/2005 Document Revised: 06/11/2016 Document Reviewed: 06/11/2016 Elsevier Interactive Patient Education  2017 [place] A heart murmur is an extra sound that is caused by chaotic blood flow. The murmur can be heard as a "hum" or "whoosh" sound when blood flows through the heart. The heart has four areas called chambers. Valves separate the upper and lower chambers from each other (tricuspid valve and mitral valve) and separate the lower chambers of the heart from pathways that lead away from the heart (aortic valve and pulmonary valve). Normally, the valves open to let blood flow through or out of your heart, and then they shut to keep the blood from flowing backward. There are two types of heart murmurs:  Innocent murmurs. Most people with this type of heart murmur do not have a heart problem. Many children have innocent heart murmurs. Your health care provider may suggest some basic testing to find out whether your murmur is an innocent murmur. If an innocent heart murmur is found, there is no need for further tests or treatment and no need to restrict activities or stop playing sports.  Abnormal murmurs. These types of murmurs can occur in children and adults. Abnormal murmurs may be a sign of a more serious heart condition, such as a heart defect present at birth (congenital defect) or heart valve disease. What are the causes? This condition is caused by heart valves that are not working properly. In children, abnormal heart murmurs are typically caused by congenital defects. In adults, abnormal murmurs are usually from heart valve problems caused by disease, infection, or aging. Three types of heart valve defects can cause a murmur:  Regurgitation. This is when blood leaks back through the valve in the wrong direction.  Mitral valve prolapse. This is when the mitral valve of the heart has a loose flap and does not close tightly.  Stenosis. This is when a  valve does not open enough and blocks blood flow. This condition may also be caused by:  Pregnancy.  Fever.  Overactive thyroid gland.  Anemia.  Exercise.  Rapid growth spurts (in children). What are the signs or symptoms? Innocent murmurs do not cause symptoms, and many people with abnormal murmurs may or may not have symptoms. If symptoms do develop, they may include:  Shortness of breath.  Blue coloring of the skin, especially on the fingertips.  Chest pain.  Palpitations, or feeling a fluttering or skipped heartbeat.  Fainting.  Persistent cough.  Getting tired much faster than expected.  Swelling in the abdomen, feet, or ankles. How is this diagnosed? This condition may be diagnosed during a routine physical or other exam. If your health care provider hears a murmur with a stethoscope, he or she will listen for:  Where the murmur is located in your heart.  How long the murmur lasts (duration).  When the murmur is heard during the heartbeat.  How loud the murmur is. This may help the health care provider figure out what is causing the murmur. You may be referred to a heart specialist (cardiologist). You may also have other tests, including:  Electrocardiogram (ECG or EKG). This test measures the electrical activity of your heart.  Echocardiogram. This test uses high frequency sound waves to make pictures of your heart.  MRI or chest X-ray.  Cardiac catheterization. This test looks at blood flow through the heart. For children and adults who have an abnormal heart murmur and want to stay active, it is important to complete testing, review test results, and receive recommendations from your health care provider. If heart disease is present, it may not be safe to play or be active. How is this treated? Heart murmurs themselves do not need treatment. In some cases, a heart murmur may go away on its own. If an underlying problem or disease is causing the murmur,  you may need treatment. If treatment is needed, it will depend on the type and severity of the disease or heart problem causing the murmur. Treatment may include:  Medicine.  Surgery.  Dietary and lifestyle changes. Follow these instructions at home:  Talk with your health care provider before participating in sports or other activities that require a lot of effort and energy (are strenuous).  Learn as much as possible about your condition and any related diseases. Ask your health care provider if you may at risk for any medical emergencies.  Talk with your health care provider about what symptoms you should look out for.  It is up to you to get your test results. Ask your health care provider, or the department that is doing the test, when  your results will be ready.  Keep all follow-up visits as told by your health care provider. This is important. Contact a health care provider if:  You feel light-headed.  You are frequently short of breath.  You feel more tired than usual.  You are having a hard time keeping up with normal activities or fitness routines.  You have swelling in your ankles or feet.  You have chest pain.  You notice that your heart often beats irregularly.  You develop any new symptoms. Get help right away if:  You develop severe chest pain.  You are having trouble breathing.  You have fainting spells.  Your symptoms suddenly get worse. These symptoms may represent a serious problem that is an emergency. Do not wait to see if the symptoms will go away. Get medical help right away. Call your local emergency services (911 in the [place] Do not drive yourself to the hospital. Summary  Normally, the heart valves open to let blood flow through or out of your heart, and then they shut to keep the blood from flowing backward.  Heart murmur is caused by heart valves that are not working properly.  You may need treatment if an underlying problem or disease  is causing the heart murmur. Treatment may include medicine, surgery, or dietary and lifestyle changes.  Talk with your health care provider before participating in sports or other activities that require a lot of effort and energy (are strenuous).  Talk with your health care provider about what symptoms you should watch out for. This information is not intended to replace advice given to you by your health care provider. Make sure you discuss any questions you have with your health care provider. Document Released: 10/25/2004 Document Revised: 09/05/2016 Document Reviewed: 09/05/2016 Elsevier Interactive Patient Education  2017 [employer].   IF you received an x-ray today, you will receive an invoice from [facility]. Please contact [facility] at [phone] with questions or concerns regarding your invoice.   IF you received labwork today, you will receive an invoice from [facility]. Please contact [facility] at [phone] with questions or concerns regarding your invoice.   Our billing staff will not be able to assist you with questions regarding bills from these companies.  You will be contacted with the lab results as soon as they are available. The fastest way to get your results is to activate your My Chart account. Instructions are located on the last page of this paperwork. If you have not heard from [place] regarding the results in 2 weeks, please contact this office.      I personally performed the services described in this documentation, which was scribed in my presence. The recorded information has been reviewed and considered for accuracy and completeness, addended by me as needed, and agree with information above.  Signed,   Merri Ray, MD [facility] at [facility].  12/26/16 1:24 PM

## 2016-12-26 DIAGNOSIS — Z1231 Encounter for screening mammogram for malignant neoplasm of breast: Secondary | ICD-10-CM | POA: Diagnosis not present

## 2016-12-26 DIAGNOSIS — Z1289 Encounter for screening for malignant neoplasm of other sites: Secondary | ICD-10-CM | POA: Diagnosis not present

## 2016-12-26 LAB — COMPREHENSIVE METABOLIC PANEL
A/G RATIO: 1.7 (ref 1.2–2.2)
ALK PHOS: 54 IU/L (ref 39–117)
ALT: 16 IU/L (ref 0–32)
AST: 20 IU/L (ref 0–40)
Albumin: 4.2 g/dL (ref 3.6–4.8)
BILIRUBIN TOTAL: 0.2 mg/dL (ref 0.0–1.2)
BUN/Creatinine Ratio: 17 (ref 12–28)
BUN: 14 mg/dL (ref 8–27)
CHLORIDE: 104 mmol/L (ref 96–106)
CO2: 22 mmol/L (ref 18–29)
Calcium: 9.2 mg/dL (ref 8.7–10.3)
Creatinine, Ser: 0.84 mg/dL (ref 0.57–1.00)
GFR calc Af Amer: 82 mL/min/{1.73_m2} (ref >59)
GFR calc non Af Amer: 71 mL/min/{1.73_m2} (ref >59)
GLOBULIN, TOTAL: 2.5 g/dL (ref 1.5–4.5)
Glucose: 91 mg/dL (ref 65–99)
POTASSIUM: 4.5 mmol/L (ref 3.5–5.2)
SODIUM: 142 mmol/L (ref 134–144)
Total Protein: 6.7 g/dL (ref 6.0–8.5)

## 2016-12-26 LAB — CBC
Hematocrit: 40.9 % (ref 34.0–46.6)
Hemoglobin: 13.5 g/dL (ref 11.1–15.9)
MCH: 31.1 pg (ref 26.6–33.0)
MCHC: 33 g/dL (ref 31.5–35.7)
MCV: 94 fL (ref 79–97)
Platelets: 265 10*3/uL (ref 150–379)
RBC: 4.34 x10E6/uL (ref 3.77–5.28)
RDW: 14.1 % (ref 12.3–15.4)
WBC: 6.6 10*3/uL (ref 3.4–10.8)

## 2016-12-26 LAB — LIPID PANEL
CHOL/HDL RATIO: 3.2 ratio (ref 0.0–4.4)
CHOLESTEROL TOTAL: 230 mg/dL — AB (ref 100–199)
HDL: 71 mg/dL (ref >39)
LDL CALC: 127 mg/dL — AB (ref 0–99)
TRIGLYCERIDES: 162 mg/dL — AB (ref 0–149)
VLDL CHOLESTEROL CAL: 32 mg/dL (ref 5–40)

## 2016-12-26 LAB — TROPONIN I: Troponin I: <0.01 ng/mL (ref 0.00–0.04)

## 2016-12-26 LAB — TSH: TSH: 2.09 u[IU]/mL (ref 0.450–4.500)

## 2016-12-26 NOTE — Progress Notes (Signed)
Cardiology Office Note   Date:  12/28/2016   ID:  Shirley Bean, DOB 08/14/1946, MRN [id number]  PCP:  Shirley Agreste, MD  Cardiologist:   Shirley Breeding, MD  Referring:  Shirley Agreste, MD  Chief Complaint  Patient presents with  . Dizziness      History of Present Illness: Shirley Bean is a 71 y.o. female who presents for evaluation of chest pain and palpitations.  She was also found to have a heart murmur.  She had near syncope.  She was seen at urgent care two days ago and I have reviewed these records.  Her symptoms are difficult to qualify or quantify. Yesterday she had dizziness without frank syncope. She had to sit down on the bed. She laid down for a couple of hours. She caught syncope but she might fall asleep. She also has other symptoms such as a flushing feeling down her arms and her legs that he has sporadic. In 2014 she had some jaw discomfort while walking in [place]. She sporadically gets some chest tightening under her left breast. None of this is reproducible. She doesn't describe associated symptoms such as nausea vomiting or diaphoresis. Doesn't really describe palpitations. 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  Past Medical History:  Diagnosis Date  . GERD (gastroesophageal reflux disease)   . H/O thyroid nodule 08/29/2015  . History of mononucleosis   . Hx of scarlet fever    age 41  . Hyperlipidemia, mixed 08/31/2015  . Thyroid disease   . Vitamin D deficiency 08/31/2015    Past Surgical History:  Procedure Laterality Date  . THYROIDECTOMY, PARTIAL     Left  . TUBAL LIGATION       Current Outpatient Prescriptions  Medication Sig Dispense Refill  . Multiple Vitamin (MULTIVITAMIN) tablet Take 1 tablet by mouth daily.     No current facility-administered medications for this visit.     Allergies:   Stadol [butorphanol] and Sulfonamide derivatives    Social History:  The patient   reports that she has never smoked. She has never used smokeless tobacco. She reports that she drinks alcohol. She reports that she does not use drugs.   Family History:  The patient's family history includes Arthritis in her sister; Cancer in her father and mother; Dementia in her father; Diabetes in her father; Heart disease in her mother.    ROS:  Please see the history of present illness.   Otherwise, review of systems are positive for none.   All other systems are reviewed and negative.    PHYSICAL EXAM: VS:  BP (!) 152/92   Pulse 70   Ht 5\' 7"  (1.702 m)   Wt 186 lb (84.4 kg)   BMI 29.13 kg/m  , BMI Body mass index is 29.13 kg/m. GENERAL:  Well appearing HEENT:  Pupils equal round and reactive, fundi not visualized, oral mucosa unremarkable NECK:  No jugular venous distention, waveform within normal limits, carotid upstroke brisk and symmetric, no bruits, no thyromegaly LYMPHATICS:  No cervical, inguinal adenopathy LUNGS:  Clear to auscultation bilaterally BACK:  No CVA tenderness CHEST:  Unremarkable HEART:  PMI not displaced or sustained,S1 and S2 within normal limits, no S3, no S4, no clicks, no rubs,  very brief apical systolic murmur, no diastolic murmurs ABD:  Flat, positive bowel sounds normal in frequency in pitch, no bruits, no rebound, no guarding, no midline pulsatile mass, no hepatomegaly,  no splenomegaly EXT:  2 plus pulses throughout, no edema, no cyanosis no clubbing SKIN:  No rashes no nodules NEURO:  Cranial nerves II through XII grossly intact, motor grossly intact throughout PSYCH:  Cognitively intact, oriented to person place and time    EKG:  EKG is not ordered today. The ekg ordered 12/25/16 sinus rhythm, rate 69, left axis deviation, RSR prime V1 and V2, no acute ST-T wave changes.   Recent Labs: 12/25/2016: ALT 16; BUN 14; Creatinine, Ser 0.84; Platelets 265; Potassium 4.5; Sodium 142; TSH 2.090    Lipid Panel    Component Value Date/Time   CHOL  230 (H) 12/25/2016 1745   TRIG 162 (H) 12/25/2016 1745   HDL 71 12/25/2016 1745   CHOLHDL 3.2 12/25/2016 1745   CHOLHDL 3 08/29/2015 1604   VLDL 16.0 08/29/2015 1604   LDLCALC 127 (H) 12/25/2016 1745      Wt Readings from Last 3 Encounters:  12/27/16 186 lb (84.4 kg)  12/25/16 191 lb 3.2 oz (86.7 kg)  08/27/16 192 lb (87.1 kg)      Other studies Reviewed: Additional studies/ records that were reviewed today include: EKG and urgent care records. Review of the above records demonstrates:  Please see elsewhere in the note.     ASSESSMENT AND PLAN:  CHEST DISCOMFORT:  This is atypical.  I think screening her with a coronary calcium score would be a reasonable starting point.  DYSLIPIDEMIA:  Her LDL is slightly elevated. However, her HDL is above 60. She at this point has no indication for treatment.  ABNORMAL EKG:  She has some left axis deviation and perhaps RV conduction delay but no overt findings. This would not indicate the need for further imaging or testing.  MURMUR:  She has a very slight systolic murmur which may represent aortic sclerosis. I don't think further imaging is indicated. She is asymptomatic with this.   Current medicines are reviewed at length with the patient today.  The patient does not have concerns regarding medicines.  The following changes have been made:  no change  Labs/ tests ordered today include:   Orders Placed This Encounter  Procedures  . CT CARDIAC SCORING     Disposition:   FU with me as needed.      Signed, Shirley Breeding, MD  12/28/2016 7:57 AM    [facility]

## 2016-12-27 ENCOUNTER — Ambulatory Visit (INDEPENDENT_AMBULATORY_CARE_PROVIDER_SITE_OTHER)
Admission: RE | Admit: 2016-12-27 | Discharge: 2016-12-27 | Disposition: A | Discharge status: Home / Self Care | Payer: Self-pay | Source: Ambulatory Visit | Attending: Cardiology | Admitting: Cardiology

## 2016-12-27 ENCOUNTER — Ambulatory Visit (INDEPENDENT_AMBULATORY_CARE_PROVIDER_SITE_OTHER): Payer: Medicare Other | Admitting: Cardiology

## 2016-12-27 ENCOUNTER — Encounter: Payer: Self-pay | Admitting: Cardiology

## 2016-12-27 VITALS — BP 152/92 | HR 70 | Ht 67.0 in | Wt 186.0 lb

## 2016-12-27 DIAGNOSIS — R079 Chest pain, unspecified: Secondary | ICD-10-CM | POA: Diagnosis not present

## 2016-12-27 DIAGNOSIS — R42 Dizziness and giddiness: Secondary | ICD-10-CM | POA: Diagnosis not present

## 2016-12-27 DIAGNOSIS — R011 Cardiac murmur, unspecified: Secondary | ICD-10-CM

## 2016-12-27 NOTE — Patient Instructions (Signed)
Medication Instructions:  Continue current medications  Labwork: None Ordered  Testing/Procedures: Your physician has requested that you have a Coronary Calcium Score Test. This test is done at our [facility].   Follow-Up: Your physician recommends that you schedule a follow-up appointment in: As Needed   Any Other Special Instructions Will Be Listed Below (If Applicable).   If you need a refill on your cardiac medications before your next appointment, please call your pharmacy.

## 2016-12-28 ENCOUNTER — Encounter: Payer: Self-pay | Admitting: *Deleted

## 2016-12-28 ENCOUNTER — Encounter: Payer: Self-pay | Admitting: Cardiology

## 2017-04-17 DIAGNOSIS — R300 Dysuria: Secondary | ICD-10-CM | POA: Diagnosis not present

## 2017-04-17 DIAGNOSIS — N816 Rectocele: Secondary | ICD-10-CM | POA: Diagnosis not present

## 2017-05-21 ENCOUNTER — Telehealth: Payer: Self-pay

## 2017-05-21 NOTE — Telephone Encounter (Signed)
Called pt to schedule Medicare Annual Wellness Visit with nurse health advisor. -nr  

## 2017-11-12 ENCOUNTER — Encounter: Payer: Self-pay | Admitting: Family Medicine

## 2017-11-12 DIAGNOSIS — R531 Weakness: Secondary | ICD-10-CM | POA: Diagnosis not present

## 2017-11-12 DIAGNOSIS — Z7689 Persons encountering health services in other specified circumstances: Secondary | ICD-10-CM | POA: Diagnosis not present

## 2017-11-12 DIAGNOSIS — J09X2 Influenza due to identified novel influenza A virus with other respiratory manifestations: Secondary | ICD-10-CM | POA: Diagnosis not present

## 2017-11-12 DIAGNOSIS — R0602 Shortness of breath: Secondary | ICD-10-CM | POA: Diagnosis not present

## 2017-11-12 DIAGNOSIS — R1110 Vomiting, unspecified: Secondary | ICD-10-CM | POA: Diagnosis not present

## 2017-11-12 DIAGNOSIS — J101 Influenza due to other identified influenza virus with other respiratory manifestations: Secondary | ICD-10-CM | POA: Diagnosis present

## 2017-11-12 DIAGNOSIS — R739 Hyperglycemia, unspecified: Secondary | ICD-10-CM | POA: Diagnosis present

## 2017-11-12 DIAGNOSIS — Z029 Encounter for administrative examinations, unspecified: Secondary | ICD-10-CM | POA: Diagnosis not present

## 2017-11-12 DIAGNOSIS — R112 Nausea with vomiting, unspecified: Secondary | ICD-10-CM | POA: Diagnosis present

## 2017-11-12 DIAGNOSIS — D72829 Elevated white blood cell count, unspecified: Secondary | ICD-10-CM | POA: Diagnosis not present

## 2017-11-12 DIAGNOSIS — R110 Nausea: Secondary | ICD-10-CM | POA: Diagnosis not present

## 2017-11-13 ENCOUNTER — Encounter: Payer: Self-pay | Admitting: Family Medicine

## 2017-11-13 DIAGNOSIS — Z029 Encounter for administrative examinations, unspecified: Secondary | ICD-10-CM | POA: Diagnosis not present

## 2017-11-13 DIAGNOSIS — R531 Weakness: Secondary | ICD-10-CM | POA: Diagnosis not present

## 2017-11-13 DIAGNOSIS — J09X2 Influenza due to identified novel influenza A virus with other respiratory manifestations: Secondary | ICD-10-CM | POA: Diagnosis not present

## 2017-11-13 DIAGNOSIS — D72829 Elevated white blood cell count, unspecified: Secondary | ICD-10-CM | POA: Diagnosis not present

## 2017-11-13 DIAGNOSIS — J101 Influenza due to other identified influenza virus with other respiratory manifestations: Secondary | ICD-10-CM | POA: Diagnosis not present

## 2017-11-13 DIAGNOSIS — R739 Hyperglycemia, unspecified: Secondary | ICD-10-CM | POA: Diagnosis not present

## 2017-11-13 DIAGNOSIS — R110 Nausea: Secondary | ICD-10-CM | POA: Diagnosis not present

## 2017-11-13 DIAGNOSIS — R112 Nausea with vomiting, unspecified: Secondary | ICD-10-CM | POA: Diagnosis not present

## 2017-11-26 ENCOUNTER — Ambulatory Visit (INDEPENDENT_AMBULATORY_CARE_PROVIDER_SITE_OTHER): Payer: Medicare Other | Admitting: Family Medicine

## 2017-11-26 ENCOUNTER — Encounter: Payer: Self-pay | Admitting: Family Medicine

## 2017-11-26 ENCOUNTER — Other Ambulatory Visit: Payer: Self-pay

## 2017-11-26 VITALS — BP 122/68 | HR 94 | Temp 98.3°F | Resp 18 | Ht 67.0 in | Wt 194.6 lb

## 2017-11-26 DIAGNOSIS — R55 Syncope and collapse: Secondary | ICD-10-CM

## 2017-11-26 DIAGNOSIS — J111 Influenza due to unidentified influenza virus with other respiratory manifestations: Secondary | ICD-10-CM | POA: Diagnosis not present

## 2017-11-26 DIAGNOSIS — R42 Dizziness and giddiness: Secondary | ICD-10-CM

## 2017-11-26 DIAGNOSIS — R5383 Other fatigue: Secondary | ICD-10-CM | POA: Diagnosis not present

## 2017-11-26 DIAGNOSIS — R6889 Other general symptoms and signs: Secondary | ICD-10-CM | POA: Diagnosis not present

## 2017-11-26 NOTE — Progress Notes (Deleted)
   Subjective:    Patient ID: Shirley Bean, female    DOB: Dec 08, 1945, 72 y.o.   MRN: [id number]  HPI Shirley Bean is a 72 y.o. female Presents today for: Chief Complaint  Patient presents with  . Influenza    [place]. Pt states she was Dx with the flu. Pt would like to discuss if she is well enough to get her flu shot and Prevnar 13. Pt states she still feels very fatigued and stills has a cough.  . Follow-up      Review of Systems     Objective:   Physical Exam        Assessment & Plan:

## 2017-11-26 NOTE — Progress Notes (Signed)
Subjective:    Patient ID: Shirley Bean, female    DOB: December 27, 1945, 72 y.o.   MRN: [id number] By signing my name below, I, Clifflena Tiah, attest that this documentation has been prepared under the direction and in the presence of Wendie Agreste, MD. Electronically Signed: Valeta Harms, Medical Scribe 11/26/2017 at 3:59 PM.  HPI Shirley Bean is a 72 y.o. female who presents to Primary Care at [facility] for a f/u of influenza. Pt was seen in outside hospital.   Pt reports that she was diagnosed with the flu on 2/12 and was taken to the ER, where they gave her Tamiflu and she was put on IV fluids. Pt reports that she left the ER on 02/13. Pt complains of dizziness at this time. Pt notes that she has had dizziness for 4 years. She notes that the nauseousness and dizziness gets worse with light.   Pt reports that she has seen an ophthalmologist and reports that they did not think her dizziness is caused by her eyes. Pt met with Cardiologist on December 28, 2016, Dr. Percival Spanish. At that visit she had a coronary calcium score of 0. Pt notes that she has been wearing sunglasses for years due to light sensitivity. Pt reports that when she does strenuous work, it affects her nauseousness. Pt states that she feels fatigue.   I reviewed the patient's 23 pages of paper work from her visit to [facility] on 11/12/17. Rocephin and Tamiflu were given. Pt tested positive for flu A. Hemoglobin 12.4, WBC normal 5.7. Lactate was normal. Normal sodium, potassium , Glucose 138. Critical care was consulted. Chest x-ray without infiltrate or effusion. Normal troponin. Ultimately treated for influenza.   EKG at the hospital compared to March 2018. Non specific T waves laterally. No apparent changes from previous EKG.   Pt denies any recent fevers, tolerating fluids, denies syncopal events or seizure activity. Used a few over-the-counter cough medicines with some improvement.   Patient Active Problem List   Diagnosis Date Noted  . Hyperlipidemia, mixed 08/31/2015  . Vitamin D deficiency 08/31/2015  . H/O thyroid nodule 08/29/2015  . History of mononucleosis   . GOITER 12/30/2007  . GERD 12/30/2007  . INSOMNIA 12/30/2007   Past Medical History:  Diagnosis Date  . GERD (gastroesophageal reflux disease)   . H/O thyroid nodule 08/29/2015  . History of mononucleosis   . Hx of scarlet fever    age 69  . Hyperlipidemia, mixed 08/31/2015  . Thyroid disease   . Vitamin D deficiency 08/31/2015   Past Surgical History:  Procedure Laterality Date  . THYROIDECTOMY, PARTIAL     Left  . TUBAL LIGATION     Allergies  Allergen Reactions  . Stadol [Butorphanol] Nausea And Vomiting  . Codeine Nausea Only  . Sulfonamide Derivatives    Prior to Admission medications   Medication Sig Start Date End Date Taking? Authorizing Provider  Eszopiclone (ESZOPICLONE) 3 MG TABS Lunesta 3 mg tablet  Take one tablet nightly at bedtime as needed sleep   Yes [provider]  Multiple Vitamin (MULTIVITAMIN) tablet Take 1 tablet by mouth daily.    [provider]   Social History   Socioeconomic History  . Marital status: Married    Spouse name: Not on file  . Number of children: 2  . Years of education: Not on file  . Highest education level: Not on file  Social Needs  . Financial resource strain: Not on file  .  Food insecurity - worry: Not on file  . Food insecurity - inability: Not on file  . Transportation needs - medical: Not on file  . Transportation needs - non-medical: Not on file  Occupational History  . Occupation: retired  Tobacco Use  . Smoking status: Never Smoker  . Smokeless tobacco: Never Used  Substance and Sexual Activity  . Alcohol use: Yes    Alcohol/week: 0.0 oz  . Drug use: No  . Sexual activity: Yes    Comment: lives with husband, retired from [employer], no dietary restrictions, minimzes meat  Other Topics Concern  . Not on file  Social  History Narrative   Lives at home with husband.      Review of Systems  Constitutional: Negative for fatigue and unexpected weight change.  Respiratory: Negative for chest tightness and shortness of breath.   Cardiovascular: Negative for chest pain, palpitations and leg swelling.  Gastrointestinal: Negative for abdominal pain and blood in stool.  Neurological: Positive for dizziness. Negative for syncope, light-headedness and headaches.      Objective:   Physical Exam  Constitutional: She is oriented to person, place, and time. She appears well-developed and well-nourished.  HENT:  Head: Normocephalic and atraumatic.  Eyes: Conjunctivae and EOM are normal. Pupils are equal, round, and reactive to light. Right eye exhibits no nystagmus. Left eye exhibits no nystagmus.  Neck: Neck supple. Carotid bruit is not present.  Cardiovascular: Normal rate, regular rhythm, normal heart sounds and intact distal pulses.  Pulmonary/Chest: Effort normal and breath sounds normal.  Abdominal: Soft. She exhibits no pulsatile midline mass. There is no tenderness.  Neurological: She is alert and oriented to person, place, and time.  Nonfocal, no appreciable weakness.    Skin: Skin is warm and dry.  Psychiatric: She has a normal mood and affect. Her behavior is normal.  Vitals reviewed.   Vitals:   11/26/17 1534  BP: 122/68  Pulse: 94  Resp: 18  Temp: 98.3 F (36.8 C)  TempSrc: Oral  SpO2: 98%  Weight: 194 lb 9.6 oz (88.3 kg)  Height: 5' 7"  (1.702 m)       Assessment & Plan:   Shirley Bean is a 72 y.o. female Influenza with respiratory manifestation,  Fatigue, unspecified type - Plan: CBC, Basic metabolic panel  - Appears to be improving, reassuring vital signs, lungs clear. Continue symptom care, RTC/ER precautions  -Check CBC/BMP for fatigue, but likely post influenza. Expect that to improve with time.   Dizziness - Plan: CBC, Basic metabolic panel, Orthostatic vital signs,  Ambulatory referral to Neurology Light sensitivity - Plan: Ambulatory referral to Neurology Near syncope - Plan: Ambulatory referral to Neurology  -Long-standing dizziness with light sensitivity. Reports symptoms present for years. Does not some possible recent worsening past few months. Nonfocal exam without acute symptoms in office.  -Refer to neurology for further evaluation as her ophthalmologist did not feel it was due to eye issue.   - ER/911 precautions if acute worsening.     No orders of the defined types were placed in this encounter.  Patient Instructions   I will check electrolytes and blood counts, but lungs are clear today. Tessalon or mucinex for cough. Return to the clinic or go to the nearest emergency room if any of your symptoms worsen or new symptoms occur.  Make sure you continue to drink plenty of fluids to help prevent worsening dizziness, but I will refer to neurology. Return to the clinic or go to  the nearest emergency room if any of your symptoms worsen or new symptoms occur.    Cough, Adult Coughing is a reflex that clears your throat and your airways. Coughing helps to heal and protect your lungs. It is normal to cough occasionally, but a cough that happens with other symptoms or lasts a long time may be a sign of a condition that needs treatment. A cough may last only 2-3 weeks (acute), or it may last longer than 8 weeks (chronic). What are the causes? Coughing is commonly caused by:  Breathing in substances that irritate your lungs.  A viral or bacterial respiratory infection.  Allergies.  Asthma.  Postnasal drip.  Smoking.  Acid backing up from the stomach into the esophagus (gastroesophageal reflux).  Certain medicines.  Chronic lung problems, including COPD (or rarely, lung cancer).  Other medical conditions such as heart failure.  Follow these instructions at home: Pay attention to any changes in your symptoms. Take these actions to help  with your discomfort:  Take medicines only as told by your health care provider. ? If you were prescribed an antibiotic medicine, take it as told by your health care provider. Do not stop taking the antibiotic even if you start to feel better. ? Talk with your health care provider before you take a cough suppressant medicine.  Drink enough fluid to keep your urine clear or pale yellow.  If the air is dry, use a cold steam vaporizer or humidifier in your bedroom or your home to help loosen secretions.  Avoid anything that causes you to cough at work or at home.  If your cough is worse at night, try sleeping in a semi-upright position.  Avoid cigarette smoke. If you smoke, quit smoking. If you need help quitting, ask your health care provider.  Avoid caffeine.  Avoid alcohol.  Rest as needed.  Contact a health care provider if:  You have new symptoms.  You cough up pus.  Your cough does not get better after 2-3 weeks, or your cough gets worse.  You cannot control your cough with suppressant medicines and you are losing sleep.  You develop pain that is getting worse or pain that is not controlled with pain medicines.  You have a fever.  You have unexplained weight loss.  You have night sweats. Get help right away if:  You cough up blood.  You have difficulty breathing.  Your heartbeat is very fast. This information is not intended to replace advice given to you by your health care provider. Make sure you discuss any questions you have with your health care provider. Document Released: 03/16/2011 Document Revised: 02/23/2016 Document Reviewed: 11/24/2014 Elsevier Interactive Patient Education  2018 [employer].  Dizziness Dizziness is a common problem. It is a feeling of unsteadiness or light-headedness. You may feel like you are about to faint. Dizziness can lead to injury if you stumble or fall. Anyone can become dizzy, but dizziness is more common in older adults.  This condition can be caused by a number of things, including medicines, dehydration, or illness. Follow these instructions at home: Eating and drinking  Drink enough fluid to keep your urine clear or pale yellow. This helps to keep you from becoming dehydrated. Try to drink more clear fluids, such as water.  Do not drink alcohol.  Limit your caffeine intake if told to do so by your health care provider. Check ingredients and nutrition facts to see if a food or beverage contains caffeine.  Limit  your salt (sodium) intake if told to do so by your health care provider. Check ingredients and nutrition facts to see if a food or beverage contains sodium. Activity  Avoid making quick movements. ? Rise slowly from chairs and steady yourself until you feel okay. ? In the morning, first sit up on the side of the bed. When you feel okay, stand slowly while you hold onto something until you know that your balance is fine.  If you need to stand in one place for a long time, move your legs often. Tighten and relax the muscles in your legs while you are standing.  Do not drive or use heavy machinery if you feel dizzy.  Avoid bending down if you feel dizzy. Place items in your home so that they are easy for you to reach without leaning over. Lifestyle  Do not use any products that contain nicotine or tobacco, such as cigarettes and e-cigarettes. If you need help quitting, ask your health care provider.  Try to reduce your stress level by using methods such as yoga or meditation. Talk with your health care provider if you need help to manage your stress. General instructions  Watch your dizziness for any changes.  Take over-the-counter and prescription medicines only as told by your health care provider. Talk with your health care provider if you think that your dizziness is caused by a medicine that you are taking.  Tell a friend or a family member that you are feeling dizzy. If he or she notices  any changes in your behavior, have this person call your health care provider.  Keep all follow-up visits as told by your health care provider. This is important. Contact a health care provider if:  Your dizziness does not go away.  Your dizziness or light-headedness gets worse.  You feel nauseous.  You have reduced hearing.  You have new symptoms.  You are unsteady on your feet or you feel like the room is spinning. Get help right away if:  You vomit or have diarrhea and are unable to eat or drink anything.  You have problems talking, walking, swallowing, or using your arms, hands, or legs.  You feel generally weak.  You are not thinking clearly or you have trouble forming sentences. It may take a friend or family member to notice this.  You have chest pain, abdominal pain, shortness of breath, or sweating.  Your vision changes.  You have any bleeding.  You have a severe headache.  You have neck pain or a stiff neck.  You have a fever. These symptoms may represent a serious problem that is an emergency. Do not wait to see if the symptoms will go away. Get medical help right away. Call your local emergency services (911 in the [place] Do not drive yourself to the hospital. Summary  Dizziness is a feeling of unsteadiness or light-headedness. This condition can be caused by a number of things, including medicines, dehydration, or illness.  Anyone can become dizzy, but dizziness is more common in older adults.  Drink enough fluid to keep your urine clear or pale yellow. Do not drink alcohol.  Avoid making quick movements if you feel dizzy. Monitor your dizziness for any changes. This information is not intended to replace advice given to you by your health care provider. Make sure you discuss any questions you have with your health care provider. Document Released: 03/13/2001 Document Revised: 10/20/2016 Document Reviewed: 10/20/2016 Elsevier Interactive Patient Education   2018 Elsevier  Inc.   IF you received an x-ray today, you will receive an invoice from [facility]. Please contact [facility] at [phone] with questions or concerns regarding your invoice.   IF you received labwork today, you will receive an invoice from [facility]. Please contact [facility] at [phone] with questions or concerns regarding your invoice.   Our billing staff will not be able to assist you with questions regarding bills from these companies.  You will be contacted with the lab results as soon as they are available. The fastest way to get your results is to activate your My Chart account. Instructions are located on the last page of this paperwork. If you have not heard from [place] regarding the results in 2 weeks, please contact this office.      I personally performed the services described in this documentation, which was scribed in my presence. The recorded information has been reviewed and considered for accuracy and completeness, addended by me as needed, and agree with information above.  Signed,   Merri Ray, MD [facility] at [place].  11/30/17 11:04 AM

## 2017-11-26 NOTE — Patient Instructions (Addendum)
I will check electrolytes and blood counts, but lungs are clear today. Tessalon or mucinex for cough. Return to the clinic or go to the nearest emergency room if any of your symptoms worsen or new symptoms occur.  Make sure you continue to drink plenty of fluids to help prevent worsening dizziness, but I will refer to neurology. Return to the clinic or go to the nearest emergency room if any of your symptoms worsen or new symptoms occur.    Cough, Adult Coughing is a reflex that clears your throat and your airways. Coughing helps to heal and protect your lungs. It is normal to cough occasionally, but a cough that happens with other symptoms or lasts a long time may be a sign of a condition that needs treatment. A cough may last only 2-3 weeks (acute), or it may last longer than 8 weeks (chronic). What are the causes? Coughing is commonly caused by:  Breathing in substances that irritate your lungs.  A viral or bacterial respiratory infection.  Allergies.  Asthma.  Postnasal drip.  Smoking.  Acid backing up from the stomach into the esophagus (gastroesophageal reflux).  Certain medicines.  Chronic lung problems, including COPD (or rarely, lung cancer).  Other medical conditions such as heart failure.  Follow these instructions at home: Pay attention to any changes in your symptoms. Take these actions to help with your discomfort:  Take medicines only as told by your health care provider. ? If you were prescribed an antibiotic medicine, take it as told by your health care provider. Do not stop taking the antibiotic even if you start to feel better. ? Talk with your health care provider before you take a cough suppressant medicine.  Drink enough fluid to keep your urine clear or pale yellow.  If the air is dry, use a cold steam vaporizer or humidifier in your bedroom or your home to help loosen secretions.  Avoid anything that causes you to cough at work or at home.  If your  cough is worse at night, try sleeping in a semi-upright position.  Avoid cigarette smoke. If you smoke, quit smoking. If you need help quitting, ask your health care provider.  Avoid caffeine.  Avoid alcohol.  Rest as needed.  Contact a health care provider if:  You have new symptoms.  You cough up pus.  Your cough does not get better after 2-3 weeks, or your cough gets worse.  You cannot control your cough with suppressant medicines and you are losing sleep.  You develop pain that is getting worse or pain that is not controlled with pain medicines.  You have a fever.  You have unexplained weight loss.  You have night sweats. Get help right away if:  You cough up blood.  You have difficulty breathing.  Your heartbeat is very fast. This information is not intended to replace advice given to you by your health care provider. Make sure you discuss any questions you have with your health care provider. Document Released: 03/16/2011 Document Revised: 02/23/2016 Document Reviewed: 11/24/2014 Elsevier Interactive Patient Education  2018 [employer].  Dizziness Dizziness is a common problem. It is a feeling of unsteadiness or light-headedness. You may feel like you are about to faint. Dizziness can lead to injury if you stumble or fall. Anyone can become dizzy, but dizziness is more common in older adults. This condition can be caused by a number of things, including medicines, dehydration, or illness. Follow these instructions at home: Eating and drinking  Drink enough fluid to keep your urine clear or pale yellow. This helps to keep you from becoming dehydrated. Try to drink more clear fluids, such as water.  Do not drink alcohol.  Limit your caffeine intake if told to do so by your health care provider. Check ingredients and nutrition facts to see if a food or beverage contains caffeine.  Limit your salt (sodium) intake if told to do so by your health care provider.  Check ingredients and nutrition facts to see if a food or beverage contains sodium. Activity  Avoid making quick movements. ? Rise slowly from chairs and steady yourself until you feel okay. ? In the morning, first sit up on the side of the bed. When you feel okay, stand slowly while you hold onto something until you know that your balance is fine.  If you need to stand in one place for a long time, move your legs often. Tighten and relax the muscles in your legs while you are standing.  Do not drive or use heavy machinery if you feel dizzy.  Avoid bending down if you feel dizzy. Place items in your home so that they are easy for you to reach without leaning over. Lifestyle  Do not use any products that contain nicotine or tobacco, such as cigarettes and e-cigarettes. If you need help quitting, ask your health care provider.  Try to reduce your stress level by using methods such as yoga or meditation. Talk with your health care provider if you need help to manage your stress. General instructions  Watch your dizziness for any changes.  Take over-the-counter and prescription medicines only as told by your health care provider. Talk with your health care provider if you think that your dizziness is caused by a medicine that you are taking.  Tell a friend or a family member that you are feeling dizzy. If he or she notices any changes in your behavior, have this person call your health care provider.  Keep all follow-up visits as told by your health care provider. This is important. Contact a health care provider if:  Your dizziness does not go away.  Your dizziness or light-headedness gets worse.  You feel nauseous.  You have reduced hearing.  You have new symptoms.  You are unsteady on your feet or you feel like the room is spinning. Get help right away if:  You vomit or have diarrhea and are unable to eat or drink anything.  You have problems talking, walking, swallowing, or  using your arms, hands, or legs.  You feel generally weak.  You are not thinking clearly or you have trouble forming sentences. It may take a friend or family member to notice this.  You have chest pain, abdominal pain, shortness of breath, or sweating.  Your vision changes.  You have any bleeding.  You have a severe headache.  You have neck pain or a stiff neck.  You have a fever. These symptoms may represent a serious problem that is an emergency. Do not wait to see if the symptoms will go away. Get medical help right away. Call your local emergency services (911 in the [place] Do not drive yourself to the hospital. Summary  Dizziness is a feeling of unsteadiness or light-headedness. This condition can be caused by a number of things, including medicines, dehydration, or illness.  Anyone can become dizzy, but dizziness is more common in older adults.  Drink enough fluid to keep your urine clear or pale yellow.  Do not drink alcohol.  Avoid making quick movements if you feel dizzy. Monitor your dizziness for any changes. This information is not intended to replace advice given to you by your health care provider. Make sure you discuss any questions you have with your health care provider. Document Released: 03/13/2001 Document Revised: 10/20/2016 Document Reviewed: 10/20/2016 Elsevier Interactive Patient Education  2018 [employer].   IF you received an x-ray today, you will receive an invoice from [facility]. Please contact [facility] at [phone] with questions or concerns regarding your invoice.   IF you received labwork today, you will receive an invoice from [place]. Please contact [facility] at [phone] with questions or concerns regarding your invoice.   Our billing staff will not be able to assist you with questions regarding bills from these companies.  You will be contacted with the lab results as soon as they are available. The fastest way  to get your results is to activate your My Chart account. Instructions are located on the last page of this paperwork. If you have not heard from [place] regarding the results in 2 weeks, please contact this office.

## 2017-11-26 NOTE — Progress Notes (Signed)
See other note

## 2017-11-27 LAB — CBC
Hematocrit: 41 % (ref 34.0–46.6)
Hemoglobin: 13.8 g/dL (ref 11.1–15.9)
MCH: 31.4 pg (ref 26.6–33.0)
MCHC: 33.7 g/dL (ref 31.5–35.7)
MCV: 93 fL (ref 79–97)
Platelets: 324 10*3/uL (ref 150–379)
RBC: 4.4 x10E6/uL (ref 3.77–5.28)
RDW: 13.6 % (ref 12.3–15.4)
WBC: 9.3 10*3/uL (ref 3.4–10.8)

## 2017-11-27 LAB — BASIC METABOLIC PANEL
BUN/Creatinine Ratio: 13 (ref 12–28)
BUN: 13 mg/dL (ref 8–27)
CALCIUM: 9.2 mg/dL (ref 8.7–10.3)
CO2: 22 mmol/L (ref 20–29)
Chloride: 105 mmol/L (ref 96–106)
Creatinine, Ser: 0.98 mg/dL (ref 0.57–1.00)
GFR calc Af Amer: 68 mL/min/{1.73_m2} (ref >59)
GFR calc non Af Amer: 59 mL/min/{1.73_m2} — ABNORMAL LOW (ref >59)
GLUCOSE: 106 mg/dL — AB (ref 65–99)
Potassium: 5.2 mmol/L (ref 3.5–5.2)
SODIUM: 142 mmol/L (ref 134–144)

## 2017-11-29 ENCOUNTER — Encounter: Payer: Self-pay | Admitting: Neurology

## 2017-11-30 ENCOUNTER — Encounter: Payer: Self-pay | Admitting: Family Medicine

## 2018-03-14 ENCOUNTER — Ambulatory Visit (INDEPENDENT_AMBULATORY_CARE_PROVIDER_SITE_OTHER): Payer: Medicare Other | Admitting: Neurology

## 2018-03-14 ENCOUNTER — Encounter: Payer: Self-pay | Admitting: Neurology

## 2018-03-14 VITALS — BP 100/80 | HR 68 | Ht 67.0 in | Wt 190.1 lb

## 2018-03-14 DIAGNOSIS — I951 Orthostatic hypotension: Secondary | ICD-10-CM | POA: Diagnosis not present

## 2018-03-14 DIAGNOSIS — R42 Dizziness and giddiness: Secondary | ICD-10-CM

## 2018-03-14 NOTE — Progress Notes (Signed)
[facility] Clinic Note - Initial Visit   Date: 03/14/18  MERIBETH VITUG MRN: [id number] DOB: 22-Nov-1945   Dear Dr. Carlota Raspberry:  Thank you for your kind referral of DAIJANAE RAFALSKI for consultation of dizziness. Although her history is well known to you, please allow [place] to reiterate it for the purpose of our medical record. The patient was accompanied to the clinic by self.    History of Present Illness: Shirley Bean is a 72 y.o. right-handed Caucasian female with hyperlipidemia and GERD presenting for evaluation of dizziness.  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   For the past 15 years, she has constant dizziness described as room spinning and light sensitivity. She also complains of bilateral tinnitus.  She does not have these symptoms when wearing glasses or sitting. Dizziness is always triggered by bright lights and changing position.  She rarely has headaches.  She does not have vision changes, facial pain, or numbness/tingling.    She has been seeing a "healer" who was able to control symptoms from February until now through use of herbal therapies and accupuncture.  Now, she only has symptoms occurring when she takes off her glasses or stands.  Her eye evaluation has been unremarkable. She has not seen ENT.  She is not interested in allopathic therapies.    Out-side paper records, electronic medical record, and images have been reviewed where available and summarized as:  Lab Results  Component Value Date   TSH 2.090 12/25/2016   Lab Results  Component Value Date   CREATININE 0.98 11/26/2017   BUN 13 11/26/2017   NA 142 11/26/2017   K 5.2 11/26/2017   CL 105 11/26/2017   CO2 22 11/26/2017    Past Medical History:  Diagnosis Date  . GERD (gastroesophageal reflux disease)   . H/O thyroid nodule 08/29/2015  . History of mononucleosis   . Hx of scarlet fever    age 57  . Hyperlipidemia, mixed 08/31/2015  . Thyroid disease   . Vitamin D  deficiency 08/31/2015    Past Surgical History:  Procedure Laterality Date  . THYROIDECTOMY, PARTIAL     Left  . TUBAL LIGATION      Medications:  Outpatient Encounter Medications as of 03/14/2018  Medication Sig  . Eszopiclone (ESZOPICLONE) 3 MG TABS Lunesta 3 mg tablet  Take one tablet nightly at bedtime as needed sleep  . Multiple Vitamin (MULTIVITAMIN) tablet Take 1 tablet by mouth daily.   No facility-administered encounter medications on file as of 03/14/2018.     Allergies:  Allergies  Allergen Reactions  . Stadol [Butorphanol] Nausea And Vomiting  . Codeine Nausea Only  . Sulfonamide Derivatives     Family History: Family History  Problem Relation Age of Onset  . Cancer Mother        Colerectal  . Heart disease Mother        pacer  . Cancer Father        prostate and kidney  . Dementia Father   . Diabetes Father   . Arthritis Sister     Social History: Social History   Tobacco Use  . Smoking status: Never Smoker  . Smokeless tobacco: Never Used  Substance Use Topics  . Alcohol use: Yes    Alcohol/week: 0.0 oz  . Drug use: No   Social History   Social History Narrative   Lives at home with husband.      Review of Systems:  CONSTITUTIONAL: No  fevers, chills, night sweats, or weight loss.   EYES: No visual changes or eye pain +photophobia ENT: No hearing changes.  No history of nose bleeds.   RESPIRATORY: No cough, wheezing and shortness of breath.   CARDIOVASCULAR: Negative for chest pain, and palpitations.   GI: Negative for abdominal discomfort, blood in stools or black stools.  No recent change in bowel habits.   GU:  No history of incontinence.   MUSCLOSKELETAL: No history of joint pain or swelling.  No myalgias.   SKIN: Negative for lesions, rash, and itching.   HEMATOLOGY/ONCOLOGY: Negative for prolonged bleeding, bruising easily, and swollen nodes.  No history of cancer.   ENDOCRINE: Negative for cold or heat intolerance, polydipsia or  goiter.   PSYCH:  No depression or anxiety symptoms.   NEURO: As Above.   Vital Signs:  BP 100/80   Pulse 68   Ht 5\' 7"  (1.702 m)   Wt 190 lb 2 oz (86.2 kg)   SpO2 97%   BMI 29.78 kg/m  Orthostatic VS for the past 24 hrs (Last 3 readings):  BP- Lying Pulse- Lying BP- Sitting Pulse- Sitting BP- Standing at 0 minutes Pulse- Standing at 0 minutes  03/14/18 0903 (!) 130/96 64 122/80 66 118/78 72    General Medical Exam:   General:  Well appearing, comfortable, wearing glasses.   Eyes/ENT: see cranial nerve examination.   Neck: No masses appreciated.  Full range of motion without tenderness.  No carotid bruits. Respiratory:  Clear to auscultation, good air entry bilaterally.   Cardiac:  Regular rate and rhythm, no murmur.   Extremities:  No deformities, edema, or skin discoloration.  Skin:  No rashes or lesions.  Neurological Exam: MENTAL STATUS including orientation to time, place, person, recent and remote memory, attention span and concentration, language, and fund of knowledge is normal.  Speech is not dysarthric.  CRANIAL NERVES: II:  No visual field defects.  Unremarkable fundi.   III-IV-VI: Pupils equal round and reactive to light.  Normal conjugate, extra-ocular eye movements in all directions of gaze.  No nystagmus.  No ptosis.   V:  Normal facial sensation.    VII:  Normal facial symmetry and movements.  No pathologic facial reflexes.  VIII:  Normal hearing and vestibular function.   IX-X:  Normal palatal movement.   XI:  Normal shoulder shrug and head rotation.   XII:  Normal tongue strength and range of motion, no deviation or fasciculation.  MOTOR:  No atrophy, fasciculations or abnormal movements.  No pronator drift.  Tone is normal.    Right Upper Extremity:    Left Upper Extremity:    Deltoid  5/5   Deltoid  5/5   Biceps  5/5   Biceps  5/5   Triceps  5/5   Triceps  5/5   Wrist extensors  5/5   Wrist extensors  5/5   Wrist flexors  5/5   Wrist flexors  5/5     Finger extensors  5/5   Finger extensors  5/5   Finger flexors  5/5   Finger flexors  5/5   Dorsal interossei  5/5   Dorsal interossei  5/5   Abductor pollicis  5/5   Abductor pollicis  5/5   Tone (Ashworth scale)  0  Tone (Ashworth scale)  0   Right Lower Extremity:    Left Lower Extremity:    Hip flexors  5/5   Hip flexors  5/5   Hip extensors  5/5  Hip extensors  5/5   Knee flexors  5/5   Knee flexors  5/5   Knee extensors  5/5   Knee extensors  5/5   Dorsiflexors  5/5   Dorsiflexors  5/5   Plantarflexors  5/5   Plantarflexors  5/5   Toe extensors  5/5   Toe extensors  5/5   Toe flexors  5/5   Toe flexors  5/5   Tone (Ashworth scale)  0  Tone (Ashworth scale)  0   MSRs:  Right                                                                 Left brachioradialis 2+  brachioradialis 2+  biceps 2+  biceps 2+  triceps 2+  triceps 2+  patellar 2+  patellar 2+  ankle jerk 2+  ankle jerk 2+  Hoffman no  Hoffman no  plantar response down  plantar response down   SENSORY:  Normal and symmetric perception of light touch, pinprick, vibration, and proprioception.  Romberg's sign absent.   COORDINATION/GAIT: Normal finger-to- nose-finger and heel-to-shin.  Intact rapid alternating movements bilaterally.  Able to rise from a chair without using arms.  Gait narrow based and stable. Tandem and stressed gait intact.    IMPRESSION: Orthostasis causing positional lightheadedness.   She had 22 point drop in her BP from supine to standing.  No signs of any neurodegenerative condition or neuropathy.  She was encouraged to stay well-hydrated and start using compression stockings.   Dizziness, tinnitus, photophobia is of unclear etiology.  With her neurological exam being normal, this is unlikely to be a primary neurological condition.  I offered to order MRI brain given the nonspecific symptoms, which she declined.  She prefers to continue to follow-up with her healer for symptom management.    Return to clinic as needed   Thank you for allowing me to participate in patient's care.  If I can answer any additional questions, I would be pleased to do so.    Sincerely,    Milind Raether K. Posey Pronto, DO

## 2018-06-20 ENCOUNTER — Other Ambulatory Visit: Payer: Self-pay

## 2018-06-20 ENCOUNTER — Ambulatory Visit (INDEPENDENT_AMBULATORY_CARE_PROVIDER_SITE_OTHER): Payer: Medicare Other | Admitting: Family Medicine

## 2018-06-20 ENCOUNTER — Encounter: Payer: Self-pay | Admitting: Family Medicine

## 2018-06-20 VITALS — BP 132/82 | HR 75 | Temp 97.9°F | Ht 67.5 in | Wt 195.2 lb

## 2018-06-20 DIAGNOSIS — Z1329 Encounter for screening for other suspected endocrine disorder: Secondary | ICD-10-CM | POA: Diagnosis not present

## 2018-06-20 DIAGNOSIS — R739 Hyperglycemia, unspecified: Secondary | ICD-10-CM

## 2018-06-20 DIAGNOSIS — Z23 Encounter for immunization: Secondary | ICD-10-CM | POA: Diagnosis not present

## 2018-06-20 DIAGNOSIS — Z0000 Encounter for general adult medical examination without abnormal findings: Secondary | ICD-10-CM

## 2018-06-20 DIAGNOSIS — Z1159 Encounter for screening for other viral diseases: Secondary | ICD-10-CM

## 2018-06-20 DIAGNOSIS — E049 Nontoxic goiter, unspecified: Secondary | ICD-10-CM

## 2018-06-20 NOTE — Patient Instructions (Addendum)
I recommend shingles vaccine - that can be done at your pharmacy. Let me know if you change your mind on pneumonia vaccine, or bone density testing. I will check some bloodwork as discussed. Let me know if there are any questions.  Thank you for coming in today.    Preventive Care 70 Years and Older, Female Preventive care refers to lifestyle choices and visits with your health care provider that can promote health and wellness. What does preventive care include?  A yearly physical exam. This is also called an annual well check.  Dental exams once or twice a year.  Routine eye exams. Ask your health care provider how often you should have your eyes checked.  Personal lifestyle choices, including: ? Daily care of your teeth and gums. ? Regular physical activity. ? Eating a healthy diet. ? Avoiding tobacco and drug use. ? Limiting alcohol use. ? Practicing safe sex. ? Taking low-dose aspirin every day. ? Taking vitamin and mineral supplements as recommended by your health care provider. What happens during an annual well check? The services and screenings done by your health care provider during your annual well check will depend on your age, overall health, lifestyle risk factors, and family history of disease. Counseling Your health care provider may ask you questions about your:  Alcohol use.  Tobacco use.  Drug use.  Emotional well-being.  Home and relationship well-being.  Sexual activity.  Eating habits.  History of falls.  Memory and ability to understand (cognition).  Work and work Statistician.  Reproductive health.  Screening You may have the following tests or measurements:  Height, weight, and BMI.  Blood pressure.  Lipid and cholesterol levels. These may be checked every 5 years, or more frequently if you are over 44 years old.  Skin check.  Lung cancer screening. You may have this screening every year starting at age 70 if you have a  30-pack-year history of smoking and currently smoke or have quit within the past 15 years.  Fecal occult blood test (FOBT) of the stool. You may have this test every year starting at age 42.  Flexible sigmoidoscopy or colonoscopy. You may have a sigmoidoscopy every 5 years or a colonoscopy every 10 years starting at age 70.  Hepatitis C blood test.  Hepatitis B blood test.  Sexually transmitted disease (STD) testing.  Diabetes screening. This is done by checking your blood sugar (glucose) after you have not eaten for a while (fasting). You may have this done every 1-3 years.  Bone density scan. This is done to screen for osteoporosis. You may have this done starting at age 70.  Mammogram. This may be done every 1-2 years. Talk to your health care provider about how often you should have regular mammograms.  Talk with your health care provider about your test results, treatment options, and if necessary, the need for more tests. Vaccines Your health care provider may recommend certain vaccines, such as:  Influenza vaccine. This is recommended every year.  Tetanus, diphtheria, and acellular pertussis (Tdap, Td) vaccine. You may need a Td booster every 10 years.  Varicella vaccine. You may need this if you have not been vaccinated.  Zoster vaccine. You may need this after age 24.  Measles, mumps, and rubella (MMR) vaccine. You may need at least one dose of MMR if you were born in 1957 or later. You may also need a second dose.  Pneumococcal 13-valent conjugate (PCV13) vaccine. One dose is recommended after age  65.  Pneumococcal polysaccharide (PPSV23) vaccine. One dose is recommended after age 79.  Meningococcal vaccine. You may need this if you have certain conditions.  Hepatitis A vaccine. You may need this if you have certain conditions or if you travel or work in places where you may be exposed to hepatitis A.  Hepatitis B vaccine. You may need this if you have certain  conditions or if you travel or work in places where you may be exposed to hepatitis B.  Haemophilus influenzae type b (Hib) vaccine. You may need this if you have certain conditions.  Talk to your health care provider about which screenings and vaccines you need and how often you need them. This information is not intended to replace advice given to you by your health care provider. Make sure you discuss any questions you have with your health care provider. Document Released: 10/14/2015 Document Revised: 06/06/2016 Document Reviewed: 07/19/2015 Elsevier Interactive Patient Education  [employer].      If you have lab work done today you will be contacted with your lab results within the next 2 weeks.  If you have not heard from [place] then please contact us. The fastest way to get your results is to register for My Chart.   IF you received an x-ray today, you will receive an invoice from [facility]iology. Please contact [facility] at [phone] with questions or concerns regarding your invoice.   IF you received labwork today, you will receive an invoice from [facility]. Please contact [facility] at [phone] with questions or concerns regarding your invoice.   Our billing staff will not be able to assist you with questions regarding bills from these companies.  You will be contacted with the lab results as soon as they are available. The fastest way to get your results is to activate your My Chart account. Instructions are located on the last page of this paperwork. If you have not heard from [place] regarding the results in 2 weeks, please contact this office.

## 2018-06-20 NOTE — Progress Notes (Signed)
Subjective:  By signing my name below, I, [name], attest that this documentation has been prepared under the direction and in the presence of Shirley Ray, MD. Electronically Signed: [name], [place]. 06/20/2018 , 3:05 PM .  Patient was seen in Room 3 .   Patient ID: Shirley Bean, female    DOB: 1946/09/11, 72 y.o.   MRN: [id number] Chief Complaint  Patient presents with  . Annual Exam    CPE   HPI Shirley Bean is a 72 y.o. female Here for annual physical and wellness visit. Patient was last seen in February for influenza.   Cardiac Prior to that visit, patient was seen in March 2018 for chest pain, dizziness and near syncope. She was found to have a heart murmur, seen by cardiology Dr. Percival Spanish on 12/27/16. She had atypical chest discomfort, so coronary calcium scoring was ordered, with score of 0. EKG read as left axis deviation, RSR V1 and V2, no acute ST-T wave changes; without need for further imaging for testing. Very slight systolic murmur thought to represent aortic sclerosis, no need for further imaging.   Hyperlipidemia Last lipid panel done March 2018. She's taking red yeast rice. She declines any repeat testing today.   Lab Results  Component Value Date   CHOL 230 (H) 12/25/2016   HDL 71 12/25/2016   LDLCALC 127 (H) 12/25/2016   TRIG 162 (H) 12/25/2016   CHOLHDL 3.2 12/25/2016   Lab Results  Component Value Date   ALT 16 12/25/2016   AST 20 12/25/2016   ALKPHOS 54 12/25/2016   BILITOT 0.2 12/25/2016   The 10-year ASCVD risk score [name], et al., 2013) is: 12%   Values used to calculate the score:     Age: 49 years     Sex: Female     Is Non-Hispanic African American: No     Diabetic: No     Tobacco smoker: No     Systolic Blood Pressure: 767 mmHg     Is BP treated: No     HDL Cholesterol: 71 mg/dL     Total Cholesterol: 230 mg/dL   Dizziness Evaluated by neurology for her dizziness in June, and also had bilateral tinnitus  for long standing dizziness for the past 15 years. She had some light sensitivity and tinnitus, triggered by bright lights and rare headaches. She's been treated with acupuncture and herbal treatment. She did have orthostasis in 20 point drop in BP; recommended compression stockings and maintaining hydration. She was offered a brain MRI for tinnitus, dizziness and photophobia, but patient declined at that visit. Per note, she wanted to follow up with her healer for symptomatic management.   Patient states her dizziness, tinnitus and sleeping has greatly improved; now only dizzy with light or standing.   Hyperglycemia Glucose was borderline elevated at 106 in February.   Cancer Screening Colonoscopy: believes last done around 2015; she called their office, believes done by Dr. [name], but was informed "not due yet".  Mammogram: reported 11/01/16; didn't do it this year, and will update in 2020.  Bone density testing: denies any further testing; denies medication for this if anything.  Thyroid: last tested in March 2018 with a goiter; left node was removed.   Immunizations  There is no immunization history on file for this patient.  Flu vaccine was given today. She would like to hold off on pneumonia vaccination. She requested Shingles vaccine which was sent in.   Hep C screening  is due.   Depression Depression screen [facility] 2/9 06/20/2018 11/26/2017 12/25/2016 08/27/2016  Decreased Interest 0 0 0 0  Down, Depressed, Hopeless 0 0 0 0  PHQ - 2 Score 0 0 0 0    Vision  Visual Acuity Screening   Right eye Left eye Both eyes  Without correction:     With correction: 20/25 20/70 20/25-1   She sees her eye doctor every 2 years.   Dentist She denies any dentures.   Exercise She does yoga about 4-6 times a week, pickle ball 1-2 times a week, and walking too.   Functional Status Survey Is the patient deaf or have difficulty hearing?: No Does the patient have difficulty seeing, even when  wearing glasses/contacts?: Yes(catoracts) Does the patient have difficulty concentrating, remembering, or making decisions?: No Does the patient have difficulty walking or climbing stairs?: No Does the patient have difficulty dressing or bathing?: No Does the patient have difficulty doing errands alone such as visiting a doctor's office or shopping?: No  Mental Status Survey 6CIT Screen 06/20/2018  What Year? 0 points  What month? 0 points  What time? 0 points  Count back from 20 0 points  Months in reverse 0 points  Repeat phrase 0 points  Total Score 0    Advanced Directives Has healthcare power of attorney.     Patient Active Problem List   Diagnosis Date Noted  . Hyperlipidemia, mixed 08/31/2015  . Vitamin D deficiency 08/31/2015  . H/O thyroid nodule 08/29/2015  . History of mononucleosis   . GOITER 12/30/2007  . GERD 12/30/2007  . INSOMNIA 12/30/2007   Past Medical History:  Diagnosis Date  . GERD (gastroesophageal reflux disease)   . H/O thyroid nodule 08/29/2015  . History of mononucleosis   . Hx of scarlet fever    age 85  . Hyperlipidemia, mixed 08/31/2015  . Thyroid disease   . Vitamin D deficiency 08/31/2015   Past Surgical History:  Procedure Laterality Date  . THYROIDECTOMY, PARTIAL     Left  . TUBAL LIGATION     Allergies  Allergen Reactions  . Stadol [Butorphanol] Nausea And Vomiting  . Codeine Nausea Only  . Sulfonamide Derivatives    Prior to Admission medications   Medication Sig Start Date End Date Taking? Authorizing Provider  Eszopiclone (ESZOPICLONE) 3 MG TABS Lunesta 3 mg tablet  Take one tablet nightly at bedtime as needed sleep    [provider]  Multiple Vitamin (MULTIVITAMIN) tablet Take 1 tablet by mouth daily.    [provider]   Social History   Socioeconomic History  . Marital status: Married    Spouse name: Not on file  . Number of children: 2  . Years of education: Not on file  . Highest education  level: Not on file  Occupational History  . Occupation: retired  Scientific laboratory technician  . Financial resource strain: Not on file  . Food insecurity:    Worry: Not on file    Inability: Not on file  . Transportation needs:    Medical: Not on file    Non-medical: Not on file  Tobacco Use  . Smoking status: Never Smoker  . Smokeless tobacco: Never Used  Substance and Sexual Activity  . Alcohol use: Yes    Alcohol/week: 0.0 standard drinks  . Drug use: No  . Sexual activity: Yes    Comment: lives with husband, retired from [employer], no dietary restrictions, minimzes meat  Lifestyle  . Physical  activity:    Days per week: Not on file    Minutes per session: Not on file  . Stress: Not on file  Relationships  . Social connections:    Talks on phone: Not on file    Gets together: Not on file    Attends religious service: Not on file    Active member of club or organization: Not on file    Attends meetings of clubs or organizations: Not on file    Relationship status: Not on file  . Intimate partner violence:    Fear of current or ex partner: Not on file    Emotionally abused: Not on file    Physically abused: Not on file    Forced sexual activity: Not on file  Other Topics Concern  . Not on file  Social History Narrative   Lives at home with husband in a 2 story home.  Has 2 children.  Retired Scientist, research (life sciences).  Education: college    Review of Systems 13 point ROS - positive for tinnitus, photosensitivity, constipation, and dizziness; see above chronic symptoms.      Objective:   Physical Exam  Constitutional: She is oriented to person, place, and time. She appears well-developed and well-nourished.  HENT:  Head: Normocephalic and atraumatic.  Right Ear: External ear normal.  Left Ear: External ear normal.  Mouth/Throat: Oropharynx is clear and moist.  Eyes: Pupils are equal, round, and reactive to light. Conjunctivae are normal.  Neck: Normal range of motion. Neck  supple. No thyromegaly present.  Cardiovascular: Normal rate, regular rhythm, normal heart sounds and intact distal pulses.  No murmur heard. Pulmonary/Chest: Effort normal and breath sounds normal. No respiratory distress. She has no wheezes.  Abdominal: Soft. Bowel sounds are normal. There is no tenderness.  Musculoskeletal: Normal range of motion. She exhibits no edema or tenderness.  Lymphadenopathy:    She has no cervical adenopathy.  Neurological: She is alert and oriented to person, place, and time.  Skin: Skin is warm and dry. No rash noted.  Psychiatric: She has a normal mood and affect. Her behavior is normal. Thought content normal.  Vitals reviewed.    Vitals:   06/20/18 1407 06/20/18 1529  BP: (!) 146/87 132/82  Pulse: 75   Temp: 97.9 F (36.6 C)   TempSrc: Oral   SpO2: 96%   Weight: 195 lb 3.2 oz (88.5 kg)   Height: 5' 7.5" (1.715 m)          Assessment & Plan:    LANETTA FIGUERO is a 72 y.o. female Medicare annual wellness visit, subsequent  - anticipatory guidance as below in AVS, screening labs if needed. Health maintenance items as above in HPI discussed/recommended as applicable.   - no concerning responses on depression, fall, or functional status screening. Any positive responses noted as above. Advanced directives discussed as in CHL.   -Bone density testing, pneumonia vaccine discussed as above and both declined.  Hyperglycemia - Plan: Comprehensive metabolic panel, Hemoglobin A1c  - screen for diabetes  Goiter - Plan: TSH Screening for thyroid disorder - Plan: TSH  -Previous thyroid surgery, goiter, screen with TSH.  Encounter for hepatitis C screening test for low risk patient - Plan: Hepatitis C antibody  Need for influenza vaccination - Plan: Flu vaccine HIGH DOSE PF (Fluzone High dose)   No orders of the defined types were placed in this encounter.  Patient Instructions     I recommend shingles vaccine - that can be done  at your  pharmacy. Let me know if you change your mind on pneumonia vaccine, or bone density testing. I will check some bloodwork as discussed. Let me know if there are any questions.  Thank you for coming in today.    Preventive Care 56 Years and Older, Female Preventive care refers to lifestyle choices and visits with your health care provider that can promote health and wellness. What does preventive care include?  A yearly physical exam. This is also called an annual well check.  Dental exams once or twice a year.  Routine eye exams. Ask your health care provider how often you should have your eyes checked.  Personal lifestyle choices, including: ? Daily care of your teeth and gums. ? Regular physical activity. ? Eating a healthy diet. ? Avoiding tobacco and drug use. ? Limiting alcohol use. ? Practicing safe sex. ? Taking low-dose aspirin every day. ? Taking vitamin and mineral supplements as recommended by your health care provider. What happens during an annual well check? The services and screenings done by your health care provider during your annual well check will depend on your age, overall health, lifestyle risk factors, and family history of disease. Counseling Your health care provider may ask you questions about your:  Alcohol use.  Tobacco use.  Drug use.  Emotional well-being.  Home and relationship well-being.  Sexual activity.  Eating habits.  History of falls.  Memory and ability to understand (cognition).  Work and work Statistician.  Reproductive health.  Screening You may have the following tests or measurements:  Height, weight, and BMI.  Blood pressure.  Lipid and cholesterol levels. These may be checked every 5 years, or more frequently if you are over 28 years old.  Skin check.  Lung cancer screening. You may have this screening every year starting at age 44 if you have a 30-pack-year history of smoking and currently smoke or have quit  within the past 15 years.  Fecal occult blood test (FOBT) of the stool. You may have this test every year starting at age 41.  Flexible sigmoidoscopy or colonoscopy. You may have a sigmoidoscopy every 5 years or a colonoscopy every 10 years starting at age 67.  Hepatitis C blood test.  Hepatitis B blood test.  Sexually transmitted disease (STD) testing.  Diabetes screening. This is done by checking your blood sugar (glucose) after you have not eaten for a while (fasting). You may have this done every 1-3 years.  Bone density scan. This is done to screen for osteoporosis. You may have this done starting at age 70.  Mammogram. This may be done every 1-2 years. Talk to your health care provider about how often you should have regular mammograms.  Talk with your health care provider about your test results, treatment options, and if necessary, the need for more tests. Vaccines Your health care provider may recommend certain vaccines, such as:  Influenza vaccine. This is recommended every year.  Tetanus, diphtheria, and acellular pertussis (Tdap, Td) vaccine. You may need a Td booster every 10 years.  Varicella vaccine. You may need this if you have not been vaccinated.  Zoster vaccine. You may need this after age 94.  Measles, mumps, and rubella (MMR) vaccine. You may need at least one dose of MMR if you were born in 1957 or later. You may also need a second dose.  Pneumococcal 13-valent conjugate (PCV13) vaccine. One dose is recommended after age 26.  Pneumococcal polysaccharide (PPSV23) vaccine. One dose is  recommended after age 27.  Meningococcal vaccine. You may need this if you have certain conditions.  Hepatitis A vaccine. You may need this if you have certain conditions or if you travel or work in places where you may be exposed to hepatitis A.  Hepatitis B vaccine. You may need this if you have certain conditions or if you travel or work in places where you may be exposed  to hepatitis B.  Haemophilus influenzae type b (Hib) vaccine. You may need this if you have certain conditions.  Talk to your health care provider about which screenings and vaccines you need and how often you need them. This information is not intended to replace advice given to you by your health care provider. Make sure you discuss any questions you have with your health care provider. Document Released: 10/14/2015 Document Revised: 06/06/2016 Document Reviewed: 07/19/2015 Elsevier Interactive Patient Education  [employer].      If you have lab work done today you will be contacted with your lab results within the next 2 weeks.  If you have not heard from [place] then please contact us. The fastest way to get your results is to register for My Chart.   IF you received an x-Bean today, you will receive an invoice from [facility]. Please contact [facility] at [phone] with questions or concerns regarding your invoice.   IF you received labwork today, you will receive an invoice from [place]. Please contact [facility] at [phone] with questions or concerns regarding your invoice.   Our billing staff will not be able to assist you with questions regarding bills from these companies.  You will be contacted with the lab results as soon as they are available. The fastest way to get your results is to activate your My Chart account. Instructions are located on the last page of this paperwork. If you have not heard from [place] regarding the results in 2 weeks, please contact this office.       I personally performed the services described in this documentation, which was scribed in my presence. The recorded information has been reviewed and considered for accuracy and completeness, addended by me as needed, and agree with information above.  Signed,   Shirley Ray, MD [facility] [place].  06/22/18 9:50 PM

## 2018-06-21 LAB — HEMOGLOBIN A1C
Est. average glucose Bld gHb Est-mCnc: 108 mg/dL
Hgb A1c MFr Bld: 5.4 % (ref 4.8–5.6)

## 2018-06-21 LAB — COMPREHENSIVE METABOLIC PANEL
A/G RATIO: 1.5 (ref 1.2–2.2)
ALBUMIN: 4.2 g/dL (ref 3.5–4.8)
ALT: 18 IU/L (ref 0–32)
AST: 20 IU/L (ref 0–40)
Alkaline Phosphatase: 52 IU/L (ref 39–117)
BILIRUBIN TOTAL: 0.2 mg/dL (ref 0.0–1.2)
BUN / CREAT RATIO: 15 (ref 12–28)
BUN: 14 mg/dL (ref 8–27)
CHLORIDE: 101 mmol/L (ref 96–106)
CO2: 22 mmol/L (ref 20–29)
Calcium: 9.4 mg/dL (ref 8.7–10.3)
Creatinine, Ser: 0.94 mg/dL (ref 0.57–1.00)
GFR, EST AFRICAN AMERICAN: 71 mL/min/{1.73_m2} (ref >59)
GFR, EST NON AFRICAN AMERICAN: 62 mL/min/{1.73_m2} (ref >59)
Globulin, Total: 2.8 g/dL (ref 1.5–4.5)
Glucose: 93 mg/dL (ref 65–99)
POTASSIUM: 4.3 mmol/L (ref 3.5–5.2)
Sodium: 139 mmol/L (ref 134–144)
TOTAL PROTEIN: 7 g/dL (ref 6.0–8.5)

## 2018-06-21 LAB — TSH: TSH: 1.84 u[IU]/mL (ref 0.450–4.500)

## 2018-06-21 LAB — HEPATITIS C ANTIBODY

## 2018-06-29 ENCOUNTER — Encounter: Payer: Self-pay | Admitting: Family Medicine

## 2018-07-14 ENCOUNTER — Encounter: Payer: Self-pay | Admitting: Family Medicine

## 2018-10-05 ENCOUNTER — Encounter: Payer: Self-pay | Admitting: Family Medicine

## 2018-10-08 IMAGING — CT CT HEART SCORING
2 series · 16 of 20 positions shown, 18 images · non-contrast
Comparison: None.

CLINICAL DATA: Risk stratification

EXAM:
Coronary Calcium Score
TECHNIQUE: The patient was scanned on a Siemens Somatom 64 slice scanner. Axial
non-contrast 3 mm slices were carried out through the heart. The
data set was analyzed on a dedicated work station and scored using
the Agatson method.

[Series 2: casc 3.0 i36f 2 bestdiast 64 % · axial · 0.35mm/px · z∈[-200,-116]mm · 8 of 38 slices shown, 10 images]
[im 5/38  vessel]
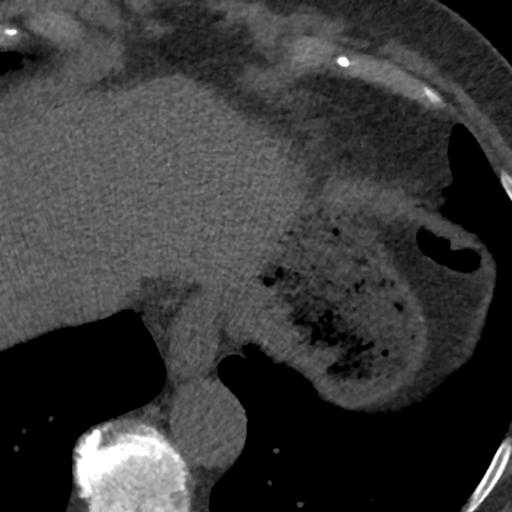
[im 5/38  lung]
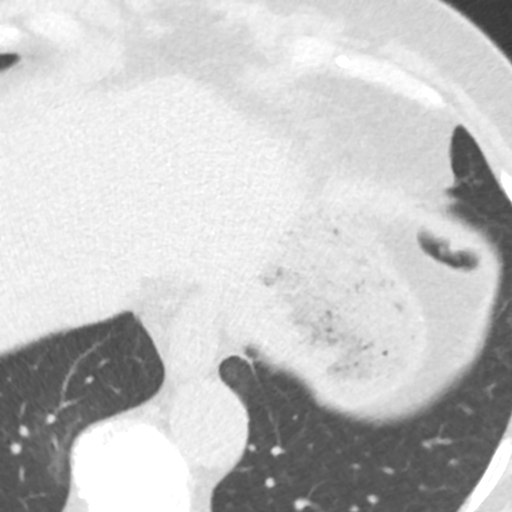
[im 9/38  vessel]
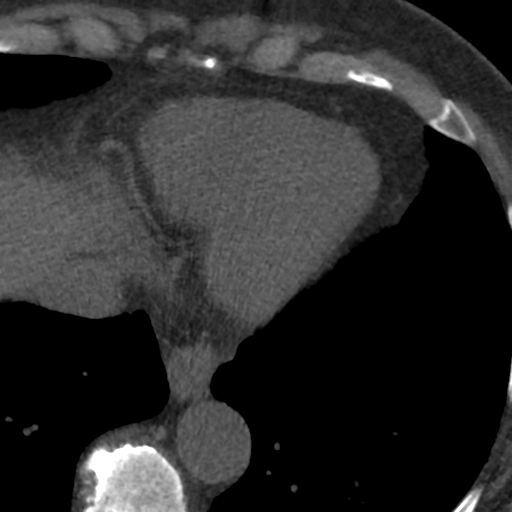
[im 13/38  vessel]
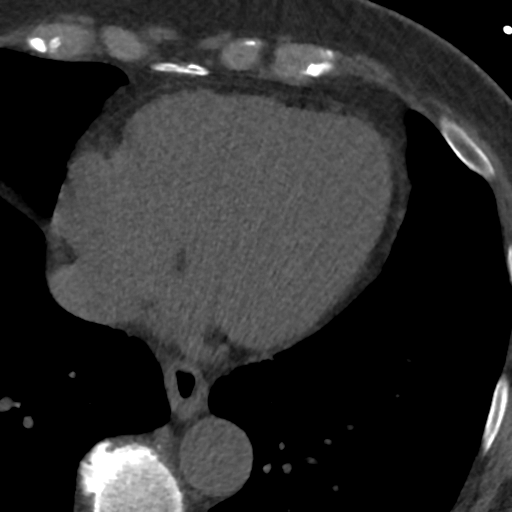
[im 17/38  vessel]
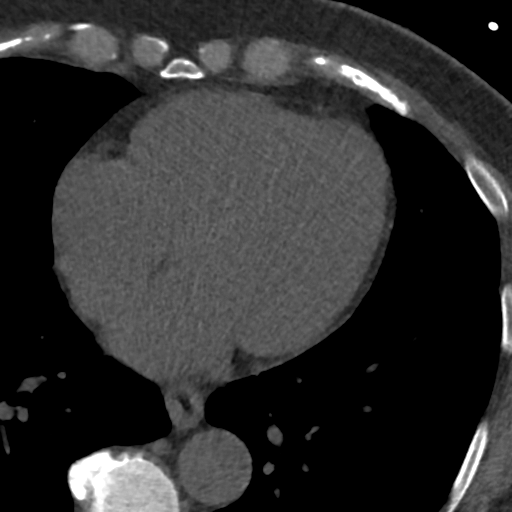
[im 21/38  vessel]
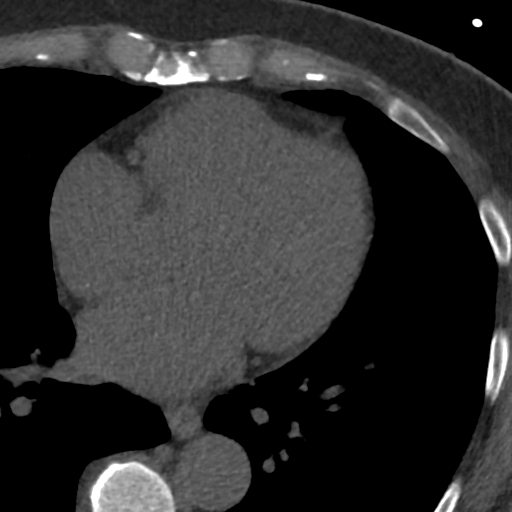
[im 21/38  lung]
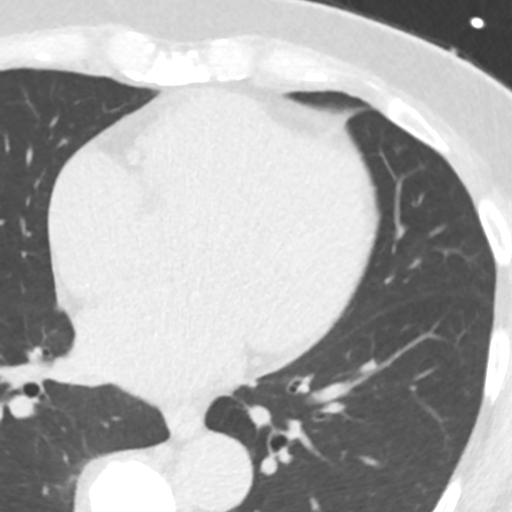
[im 25/38  vessel]
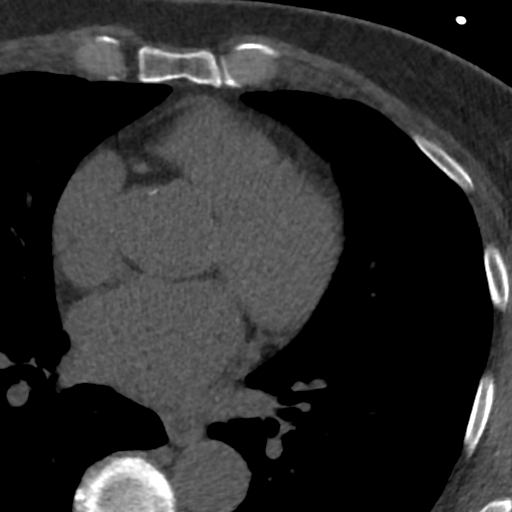
[im 29/38  vessel]
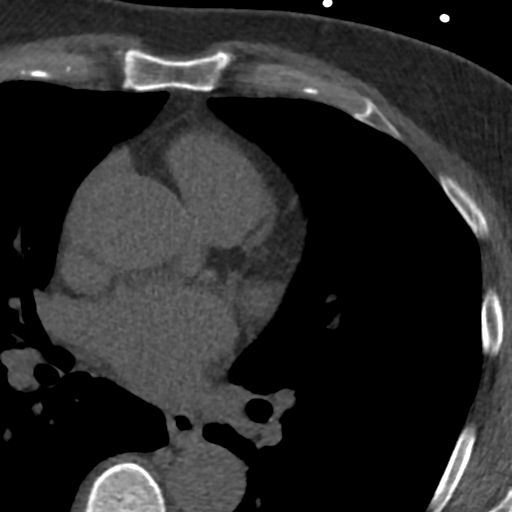
[im 33/38  vessel]
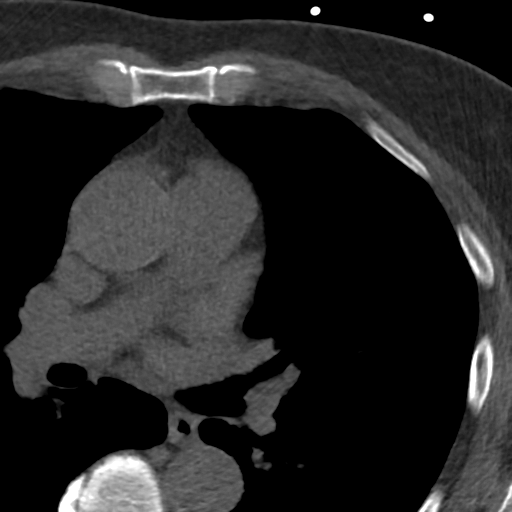

[Series 4: lung st 65 % · axial · 0.62mm/px · z∈[-200,-116]mm · 8 of 38 slices shown]
[im 5/38  lung]
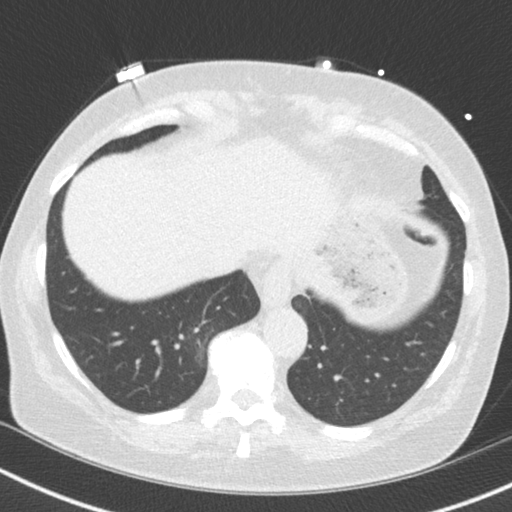
[im 9/38  lung]
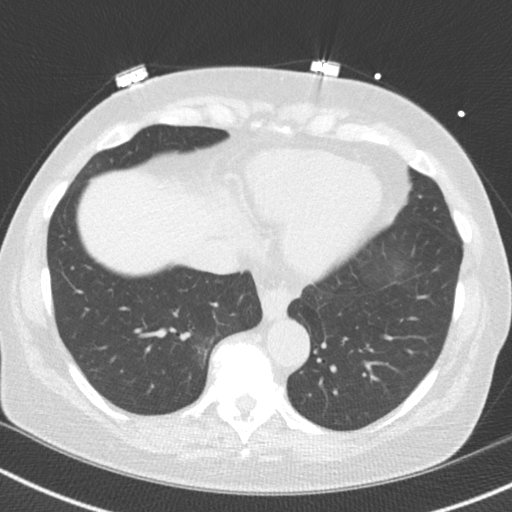
[im 13/38  lung]
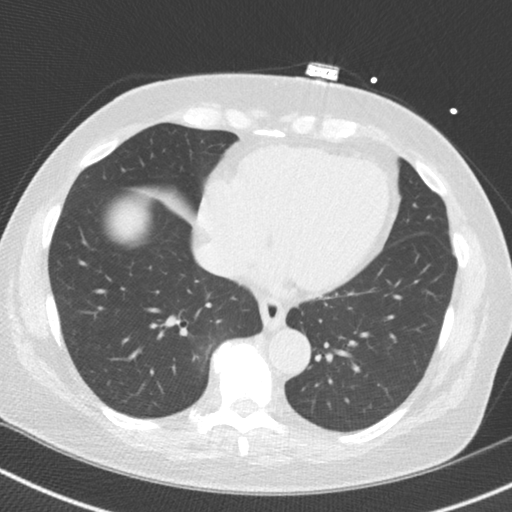
[im 17/38  lung]
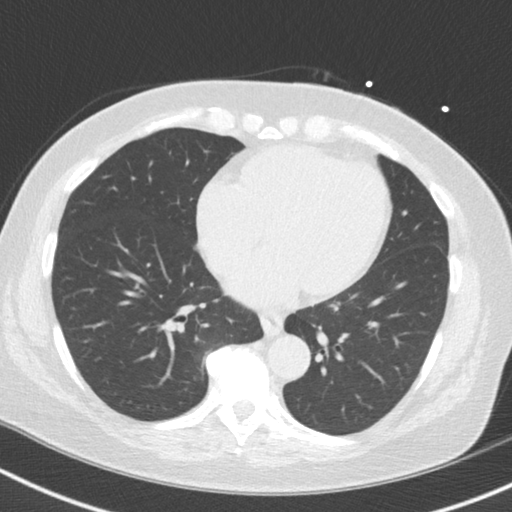
[im 21/38  lung]
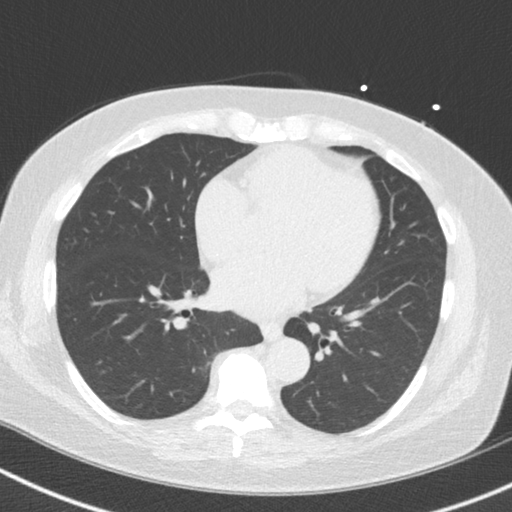
[im 25/38  lung]
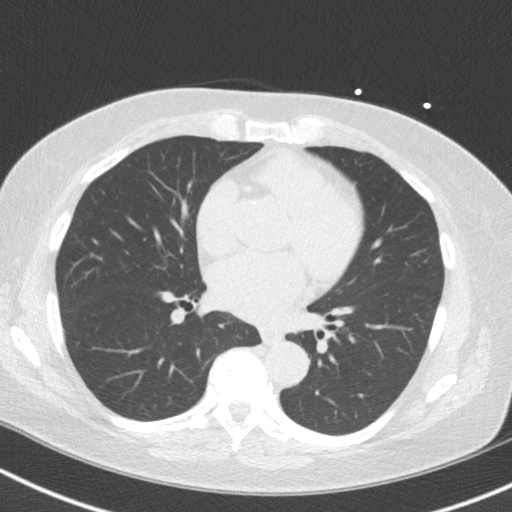
[im 29/38  lung]
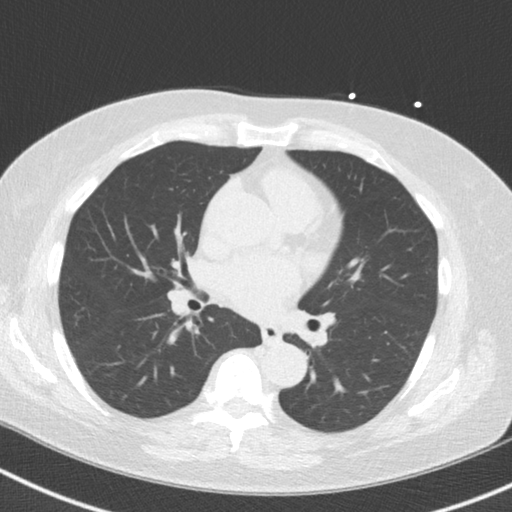
[im 33/38  lung]
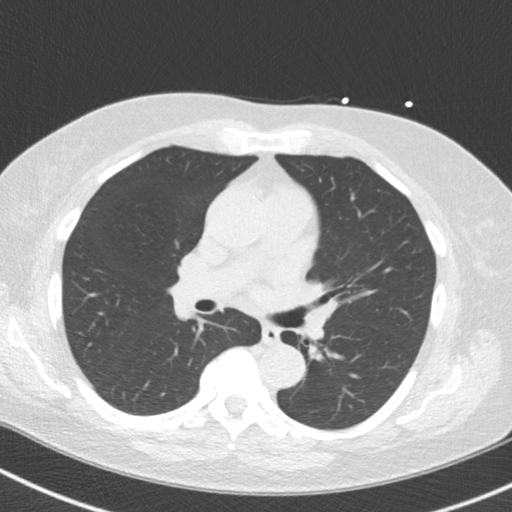

[16 of 20 positions shown; findings below may reference images not displayed]

FINDINGS: Non-cardiac: See separate report from [REDACTED].

Ascending Aorta:  Mild diffuse calcifications.

Pericardium: Normal.
IMPRESSION: Coronary calcium score of 0. This was 0 percentile for age and sex
matched control.

Yun Kwong Mandy

EXAM:
OVER-READ INTERPRETATION  CT CHEST

The following report is an over-read performed by radiologist Dr.
over-read does not include interpretation of cardiac or coronary
anatomy or pathology. The coronary calcium score interpretation by
the cardiologist is attached.
FINDINGS: Within the visualized portions of the thorax there are no suspicious
appearing pulmonary nodules or masses, there is no acute
consolidative airspace disease, no pleural effusions, no
pneumothorax and no lymphadenopathy. Visualized portions of the
upper abdomen are unremarkable. There are no aggressive appearing
lytic or blastic lesions noted in the visualized portions of the
skeleton.
IMPRESSION: 1. No significant incidental noncardiac findings are noted.

## 2018-10-20 DIAGNOSIS — Z1231 Encounter for screening mammogram for malignant neoplasm of breast: Secondary | ICD-10-CM | POA: Diagnosis not present

## 2018-10-29 DIAGNOSIS — H2513 Age-related nuclear cataract, bilateral: Secondary | ICD-10-CM | POA: Diagnosis not present

## 2018-10-29 DIAGNOSIS — H02401 Unspecified ptosis of right eyelid: Secondary | ICD-10-CM | POA: Diagnosis not present

## 2018-11-26 DIAGNOSIS — L570 Actinic keratosis: Secondary | ICD-10-CM | POA: Diagnosis not present

## 2018-11-26 DIAGNOSIS — D229 Melanocytic nevi, unspecified: Secondary | ICD-10-CM | POA: Diagnosis not present

## 2018-12-13 ENCOUNTER — Encounter: Payer: Self-pay | Admitting: Family Medicine

## 2019-03-09 DIAGNOSIS — L570 Actinic keratosis: Secondary | ICD-10-CM | POA: Diagnosis not present

## 2019-06-09 DIAGNOSIS — D229 Melanocytic nevi, unspecified: Secondary | ICD-10-CM

## 2019-06-09 DIAGNOSIS — D485 Neoplasm of uncertain behavior of skin: Secondary | ICD-10-CM | POA: Diagnosis not present

## 2019-06-09 DIAGNOSIS — L818 Other specified disorders of pigmentation: Secondary | ICD-10-CM | POA: Diagnosis not present

## 2019-06-09 HISTORY — DX: Melanocytic nevi, unspecified: D22.9

## 2019-06-22 DIAGNOSIS — Z01812 Encounter for preprocedural laboratory examination: Secondary | ICD-10-CM | POA: Diagnosis not present

## 2019-06-24 DIAGNOSIS — Z1159 Encounter for screening for other viral diseases: Secondary | ICD-10-CM | POA: Diagnosis not present

## 2019-06-25 DIAGNOSIS — Z800 Family history of malignant neoplasm of digestive organs: Secondary | ICD-10-CM | POA: Diagnosis not present

## 2019-06-25 DIAGNOSIS — K5730 Diverticulosis of large intestine without perforation or abscess without bleeding: Secondary | ICD-10-CM | POA: Diagnosis not present

## 2019-06-25 DIAGNOSIS — Z1211 Encounter for screening for malignant neoplasm of colon: Secondary | ICD-10-CM | POA: Diagnosis not present

## 2019-06-25 LAB — HM COLONOSCOPY

## 2019-07-17 ENCOUNTER — Ambulatory Visit (INDEPENDENT_AMBULATORY_CARE_PROVIDER_SITE_OTHER): Payer: Medicare Other | Admitting: Family Medicine

## 2019-07-17 ENCOUNTER — Other Ambulatory Visit: Payer: Self-pay

## 2019-07-17 DIAGNOSIS — Z23 Encounter for immunization: Secondary | ICD-10-CM

## 2019-08-11 ENCOUNTER — Other Ambulatory Visit: Payer: Self-pay

## 2019-08-11 DIAGNOSIS — Z20822 Contact with and (suspected) exposure to covid-19: Secondary | ICD-10-CM

## 2019-08-11 DIAGNOSIS — Z20828 Contact with and (suspected) exposure to other viral communicable diseases: Secondary | ICD-10-CM | POA: Diagnosis not present

## 2019-08-12 ENCOUNTER — Encounter: Payer: Self-pay | Admitting: Family Medicine

## 2019-08-14 LAB — NOVEL CORONAVIRUS, NAA: SARS-CoV-2, NAA: NOT DETECTED

## 2019-08-18 ENCOUNTER — Encounter: Payer: Self-pay | Admitting: Family Medicine

## 2019-08-18 NOTE — Progress Notes (Unsigned)
Impression: Diverticulosis in the sigmoid colon. Repeat colonoscopy in 5 years

## 2019-10-22 ENCOUNTER — Encounter: Payer: Self-pay | Admitting: Family Medicine

## 2019-10-26 ENCOUNTER — Encounter: Payer: Self-pay | Admitting: *Deleted

## 2019-10-27 NOTE — Telephone Encounter (Incomplete)
Spoke with patient via phone.  Strongly recommended ED for intermittent episodes of chest tightness.  Pt adamantly refuses ED.  Advised although we recommend ED, if she is refusing, could schedule here but vi

## 2019-10-27 NOTE — Telephone Encounter (Signed)
Contacted pt via phone.  Strongly recommended she go to ED for the reported symptoms.  Pt adamantly refused.  Advised that we could see here but may still send her to ED and visit would most likely require EKG.  Pt initially refused EKG and said she only wanted phone appt. Advised must be in office for eval.  Pt transferred for scheduling.  Offered appt for today with same day provider but pt refused.

## 2019-10-29 ENCOUNTER — Encounter: Payer: Self-pay | Admitting: Family Medicine

## 2019-10-30 ENCOUNTER — Ambulatory Visit (INDEPENDENT_AMBULATORY_CARE_PROVIDER_SITE_OTHER): Payer: Medicare Other

## 2019-10-30 ENCOUNTER — Other Ambulatory Visit: Payer: Self-pay

## 2019-10-30 ENCOUNTER — Ambulatory Visit (INDEPENDENT_AMBULATORY_CARE_PROVIDER_SITE_OTHER): Payer: Medicare Other | Admitting: Family Medicine

## 2019-10-30 ENCOUNTER — Encounter: Payer: Self-pay | Admitting: Family Medicine

## 2019-10-30 VITALS — BP 132/79 | HR 81 | Temp 98.7°F | Resp 16 | Ht 67.5 in | Wt 213.0 lb

## 2019-10-30 DIAGNOSIS — R0789 Other chest pain: Secondary | ICD-10-CM

## 2019-10-30 DIAGNOSIS — R079 Chest pain, unspecified: Secondary | ICD-10-CM | POA: Diagnosis not present

## 2019-10-30 DIAGNOSIS — M25529 Pain in unspecified elbow: Secondary | ICD-10-CM | POA: Diagnosis not present

## 2019-10-30 NOTE — Patient Instructions (Addendum)
  X-ray of your chest did not show any acute findings.  There were some areas of possible arthritis of the left shoulder and possible loose bodies.  We can discuss this further at follow-up but does not appear to be related to recurrent chest symptoms.  EKG was also reassuring.  I will check a sed rate or inflammation test, but will refer you back to cardiology to evaluate the symptoms further.  Can discuss possibility of coronary artery spasm with them at that time.  Follow-up after cardiology to determine next step if needed.  If any acute worsening of symptoms, I do recommend evaluation through the emergency room at the time of the symptoms as other testing can be performed at that time that may be helpful in work-up of your symptoms.  Return to the clinic or go to the nearest emergency room if any of your symptoms worsen or new symptoms occur.     If you have lab work done today you will be contacted with your lab results within the next 2 weeks.  If you have not heard from [place] then please contact us. The fastest way to get your results is to register for My Chart.   IF you received an x-ray today, you will receive an invoice from [facility]. Please contact [facility] at [phone] with questions or concerns regarding your invoice.   IF you received labwork today, you will receive an invoice from [place]. Please contact [facility] at [phone] with questions or concerns regarding your invoice.   Our billing staff will not be able to assist you with questions regarding bills from these companies.  You will be contacted with the lab results as soon as they are available. The fastest way to get your results is to activate your My Chart account. Instructions are located on the last page of this paperwork. If you have not heard from [place] regarding the results in 2 weeks, please contact this office.

## 2019-10-30 NOTE — Progress Notes (Signed)
Subjective:  Patient ID: Shirley Bean, female    DOB: 1946-03-26  Age: 74 y.o. MRN: [id number]  CC:  Chief Complaint  Patient presents with  . Chest Pain    from both arms this month (JAN 2021)    HPI ITZAYANNA BENGTSON presents for  Chest pain  Current symptoms past 1 month - tightness feeling since early January, tightness in upper arms, moves down into arms, and entire chest feels tight to bra line. No fall, injury. Every 5 days, lasts few minutes. Not necessarily associated with rest or activity.  Takes 1-2 aspirin when occurs. Tightness down both arms, but not neck/back/jaw radiation. Feels like a vice grip.  No associated nausea/diaphoresis/dyspnea. Last episode 1/25. No current chest pain. No recent prolonged car travel or air travel, no recent calf pain or swelling.no recent surgery. No hx of DVT's. No recent surgery.  No fever,  Hx of GERD, but not feeling like heartburn during events - heartburn is better.   Chronic dizziness, but not associated with CP.   Email sent yesterday regarding possible immune issues, inflammation as cause of symptoms.  Reports scarlet fever at age 29, numerous mono cases and infections in her 68s.  We discussed chest pain in March 2018.  Had recurrent symptoms at that time, had been seen by [facility] cardiology previously.  EKG without acute findings at that time, referred to cardiology as also noted to have heart murmur.  She was evaluated by Dr. Percival Spanish at [facility] 12/27/2016.  Atypical chest pain.  Coronary calcium score was thought to be a reasonable starting point.  Left axis deviation and possible RV conduction delay on EKG.  Slight systolic murmur that may represent aortic sclerosis, no further imaging was indicated at that time.  Coronary calcium score of 0.    History Patient Active Problem List   Diagnosis Date Noted  . Hyperlipidemia, mixed 08/31/2015  . Vitamin D deficiency 08/31/2015  . H/O thyroid  nodule 08/29/2015  . History of mononucleosis   . GOITER 12/30/2007  . GERD 12/30/2007  . INSOMNIA 12/30/2007   Past Medical History:  Diagnosis Date  . GERD (gastroesophageal reflux disease)   . H/O thyroid nodule 08/29/2015  . History of mononucleosis   . Hx of scarlet fever    age 61  . Hyperlipidemia, mixed 08/31/2015  . Thyroid disease   . Vitamin D deficiency 08/31/2015   Past Surgical History:  Procedure Laterality Date  . THYROIDECTOMY, PARTIAL     Left  . TUBAL LIGATION     Allergies  Allergen Reactions  . Stadol [Butorphanol] Nausea And Vomiting  . Codeine Nausea Only  . Sulfonamide Derivatives    Prior to Admission medications   Medication Sig Start Date End Date Taking? Authorizing Provider  aspirin EC 325 MG tablet Take 325 mg by mouth daily.   Yes [provider]  lactobacillus acidophilus (BACID) TABS tablet Take 2 tablets by mouth daily.   Yes [provider]  MAGNESIUM PO Take by mouth daily.   Yes [provider]  Omega 3 1000 MG CAPS Take by mouth.   Yes [provider]  OVER THE COUNTER MEDICATION daily.   Yes [provider]  OVER THE COUNTER MEDICATION daily.   Yes [provider]  OVER THE COUNTER MEDICATION    Yes [provider]  [place]    Yes [provider]  OVER THE COUNTER MEDICATION    Yes  [provider]  OVER THE COUNTER MEDICATION 100 mg.   Yes [provider]  Red Yeast Rice Extract (RED YEAST RICE PO) Take by mouth.   Yes [provider]  TURMERIC PO Take by mouth daily.   Yes [provider]  VITAMIN D PO Take by mouth.   Yes [provider]   Social History   Socioeconomic History  . Marital status: Married    Spouse name: Not on file  . Number of children: 2  . Years of education: Not on file  . Highest education level: Not on file  Occupational History  . Occupation: retired  Tobacco Use    . Smoking status: Never Smoker  . Smokeless tobacco: Never Used  Substance and Sexual Activity  . Alcohol use: Yes    Alcohol/week: 0.0 standard drinks  . Drug use: No  . Sexual activity: Yes    Comment: lives with husband, retired from [employer], no dietary restrictions, minimzes meat  Other Topics Concern  . Not on file  Social History Narrative   Lives at home with husband in a 2 story home.  Has 2 children.  Retired Scientist, research (life sciences).  Education: college   Social Determinants of Radio broadcast assistant Strain:   . Difficulty of Paying Living Expenses: Not on file  Food Insecurity:   . Worried About Charity fundraiser in the Last Year: Not on file  . Ran Out of Food in the Last Year: Not on file  Transportation Needs:   . Lack of Transportation (Medical): Not on file  . Lack of Transportation (Non-Medical): Not on file  Physical Activity:   . Days of Exercise per Week: Not on file  . Minutes of Exercise per Session: Not on file  Stress:   . Feeling of Stress : Not on file  Social Connections:   . Frequency of Communication with Friends and Family: Not on file  . Frequency of Social Gatherings with Friends and Family: Not on file  . Attends Religious Services: Not on file  . Active Member of Clubs or Organizations: Not on file  . Attends Archivist Meetings: Not on file  . Marital Status: Not on file  Intimate Partner Violence:   . Fear of Current or Ex-Partner: Not on file  . Emotionally Abused: Not on file  . Physically Abused: Not on file  . Sexually Abused: Not on file    Review of Systems  Per HPI.  Objective:   Vitals:   10/30/19 1400  BP: 132/79  Pulse: 81  Resp: 16  Temp: 98.7 F (37.1 C)  TempSrc: Temporal  SpO2: 96%  Weight: 213 lb (96.6 kg)  Height: 5' 7.5" (1.715 m)     Physical Exam Vitals reviewed.  Constitutional:      Appearance: She is well-developed.  HENT:     Head: Normocephalic and atraumatic.  Eyes:      Conjunctiva/sclera: Conjunctivae normal.     Pupils: Pupils are equal, round, and reactive to light.  Neck:     Vascular: No carotid bruit.  Cardiovascular:     Rate and Rhythm: Normal rate and regular rhythm.     Heart sounds: Normal heart sounds.  Pulmonary:     Effort: Pulmonary effort is normal. No tachypnea or respiratory distress.     Breath sounds: Normal breath sounds. No stridor.  Abdominal:     Palpations: Abdomen is soft. There is no pulsatile mass.  Tenderness: There is no abdominal tenderness.  Musculoskeletal:     Comments: Calves nt, neg homans.   Clavicles nt, pain free rom of neck. No current chest pain.   Skin:    General: Skin is warm and dry.  Neurological:     Mental Status: She is alert and oriented to person, place, and time.  Psychiatric:        Behavior: Behavior normal.     Ekg: Sinus rhythm, rate 61, no apparent acute findings.  DG Chest 2 View  Result Date: 10/30/2019 CLINICAL DATA:  Intermittent chest tightness, chest pain, history GERD EXAM: CHEST - 2 VIEW COMPARISON:  12/25/2016 FINDINGS: Normal heart size, mediastinal contours, and pulmonary vascularity. Atherosclerotic calcification aorta. Lungs clear. No infiltrate, pleural effusion or pneumothorax. Multiple small calcifications adjacent to the proximal LEFT humerus unchanged question small calcified loose bodies. Diffuse narrowing of the LEFT glenohumeral joint. Scattered endplate spur formation thoracic spine. IMPRESSION: No acute abnormalities. Electronically Signed   By: Lavonia Dana M.D.   On: 10/30/2019 15:26   Chronic shoulder pain for years, improved with yoga.   Assessment & Plan:  JAYD OBERG is a 74 y.o. female . Chest tightness - Plan: EKG 12-Lead, DG Chest 2 View, CBC, Sedimentation Rate, Ambulatory referral to Cardiology  Arthralgia of upper arm, unspecified laterality - Plan: EKG 12-Lead, Ambulatory referral to Cardiology Reassuring EKG, chest x-ray.  Prior coronary  calcium scoring also reassuring, less likely coronary artery disease.  Less likely pericarditis but will check sed rate.  No known risk factors for pulmonary embolus, and intermittent symptoms unlikely.  Differential does include coronary vasospasm which she is concerned about.  Discussed calcium channel blocker or nitrates, but decided against medications at this time.  Will refer to cardiology to discuss further, also discuss further testing but ER precautions were given if increased frequency or worsening symptoms.  Understanding expressed  No orders of the defined types were placed in this encounter.  Patient Instructions    X-ray of your chest did not show any acute findings.  There were some areas of possible arthritis of the left shoulder and possible loose bodies.  We can discuss this further at follow-up but does not appear to be related to recurrent chest symptoms.  EKG was also reassuring.  I will check a sed rate or inflammation test, but will refer you back to cardiology to evaluate the symptoms further.  Can discuss possibility of coronary artery spasm with them at that time.  Follow-up after cardiology to determine next step if needed.  If any acute worsening of symptoms, I do recommend evaluation through the emergency room at the time of the symptoms as other testing can be performed at that time that may be helpful in work-up of your symptoms.  Return to the clinic or go to the nearest emergency room if any of your symptoms worsen or new symptoms occur.     If you have lab work done today you will be contacted with your lab results within the next 2 weeks.  If you have not heard from [place] then please contact us. The fastest way to get your results is to register for My Chart.   IF you received an x-ray today, you will receive an invoice from [facility]. Please contact [facility] at [phone] with questions or concerns regarding your invoice.   IF you received  labwork today, you will receive an invoice from [facility]. Please contact [facility] at [phone] with questions or concerns  regarding your invoice.   Our billing staff will not be able to assist you with questions regarding bills from these companies.  You will be contacted with the lab results as soon as they are available. The fastest way to get your results is to activate your My Chart account. Instructions are located on the last page of this paperwork. If you have not heard from [place] regarding the results in 2 weeks, please contact this office.         Signed, Merri Ray, MD [facility]

## 2019-10-31 LAB — CBC
Hematocrit: 41.4 % (ref 34.0–46.6)
Hemoglobin: 14.3 g/dL (ref 11.1–15.9)
MCH: 31.6 pg (ref 26.6–33.0)
MCHC: 34.5 g/dL (ref 31.5–35.7)
MCV: 92 fL (ref 79–97)
Platelets: 190 10*3/uL (ref 150–450)
RBC: 4.52 x10E6/uL (ref 3.77–5.28)
RDW: 12.7 % (ref 11.7–15.4)
WBC: 7.7 10*3/uL (ref 3.4–10.8)

## 2019-10-31 LAB — SEDIMENTATION RATE: Sed Rate: 16 mm/hr (ref 0–40)

## 2019-11-05 ENCOUNTER — Encounter: Payer: Self-pay | Admitting: Cardiology

## 2019-11-05 DIAGNOSIS — Z7189 Other specified counseling: Secondary | ICD-10-CM | POA: Insufficient documentation

## 2019-11-05 DIAGNOSIS — R072 Precordial pain: Secondary | ICD-10-CM

## 2019-11-05 DIAGNOSIS — R011 Cardiac murmur, unspecified: Secondary | ICD-10-CM

## 2019-11-05 DIAGNOSIS — R9431 Abnormal electrocardiogram [ECG] [EKG]: Secondary | ICD-10-CM | POA: Insufficient documentation

## 2019-11-05 DIAGNOSIS — E785 Hyperlipidemia, unspecified: Secondary | ICD-10-CM | POA: Insufficient documentation

## 2019-11-05 HISTORY — DX: Precordial pain: R07.2

## 2019-11-05 HISTORY — DX: Cardiac murmur, unspecified: R01.1

## 2019-11-05 NOTE — Progress Notes (Signed)
Cardiology Office Note   Date:  11/06/2019   ID:  Shirley Bean, Shirley Bean [birth year], MRN [id number]  PCP:  Wendie Agreste, MD  Cardiologist:   Minus Breeding, MD  Referring:  Wendie Agreste, MD  No chief complaint on file.     History of Present Illness: Shirley Bean is a 74 y.o. female who presents for evaluation of chest pain and palpitations.  She was also found to have a heart murmur.  She had near syncope.  I have not seen her since 2018.  She had chest pain.  However, it was atypical and her coronary calcium score was zero.      She returns for evaluation of discomfort.  She describes it as a tightness.  It seems to be like a vice grip on the tops of her arms and shoulders.  It goes under her left breast.  It has been happening for several weeks.  The worst episode was January 15.  She cannot remember how long it lasts.  When she gets these episodes she takes an aspirin and she lies down.  It can happen in the morning before she does anything.  It can happen after yoga.  It is not reproducible particularly with activities.  She is not describing substernal chest pressure.  She may have had a little pain in her shoulders.  She has had some mild ankle swelling.  She tries walking but she has noticed that she has decreased exercise tolerance for walking.  She has tightness in her legs.  She is not had any resting shortness of breath, PND or orthopnea.  She had no cough fevers or chills   Past Medical History:  Diagnosis Date  . GERD (gastroesophageal reflux disease)   . H/O thyroid nodule 08/29/2015  . History of mononucleosis   . Hx of scarlet fever    age 47  . Hyperlipidemia, mixed 08/31/2015  . Murmur 11/05/2019  . Thyroid disease   . Vitamin D deficiency 08/31/2015    Past Surgical History:  Procedure Laterality Date  . THYROIDECTOMY, PARTIAL     Left  . TUBAL LIGATION       Current Outpatient Medications  Medication Sig Dispense Refill  . aspirin EC 325  MG tablet Take 325 mg by mouth daily.    Marland Kitchen lactobacillus acidophilus (BACID) TABS tablet Take 2 tablets by mouth daily.    Marland Kitchen MAGNESIUM PO Take by mouth daily.    . nitroGLYCERIN (NITROSTAT) 0.4 MG SL tablet Place 1 tablet (0.4 mg total) under the tongue every 5 (five) minutes as needed for chest pain. 30 tablet 3  . Omega 3 1000 MG CAPS Take by mouth.    Marland Kitchen OVER THE COUNTER MEDICATION daily.    Marland Kitchen OVER THE COUNTER MEDICATION daily.    Marland Kitchen OVER THE COUNTER MEDICATION     . OVER THE COUNTER MEDICATION     . OVER THE COUNTER MEDICATION     . OVER THE COUNTER MEDICATION 100 mg.    . Red Yeast Rice Extract (RED YEAST RICE PO) Take by mouth.    . TURMERIC PO Take by mouth daily.    Marland Kitchen VITAMIN D PO Take by mouth.     No current facility-administered medications for this visit.    Allergies:   Stadol [butorphanol], Codeine, and Sulfonamide derivatives    ROS:  Please see the history of present illness.   Otherwise, review of systems are positive for none.  All other systems are reviewed and negative.    PHYSICAL EXAM: VS:  There were no vitals taken for this visit. , BMI There is no height or weight on file to calculate BMI. GENERAL:  Well appearing NECK:  No jugular venous distention, waveform within normal limits, carotid upstroke brisk and symmetric, no bruits, no thyromegaly LUNGS:  Clear to auscultation bilaterally CHEST:  Unremarkable HEART:  PMI not displaced or sustained,S1 and S2 within normal limits, no S3, no S4, no clicks, no rubs, no murmurs ABD:  Flat, positive bowel sounds normal in frequency in pitch, no bruits, no rebound, no guarding, no midline pulsatile mass, no hepatomegaly, no splenomegaly EXT:  2 plus pulses throughout, no edema, no cyanosis no clubbing    EKG:  EKG is is notordered today. The ekg ordered 10/30/2019 sinus rhythm, rate 61, left axis deviation, RSR prime V1 and V2, no acute ST-T wave changes.   Recent Labs: 10/30/2019: Hemoglobin 14.3; Platelets 190     Lipid Panel    Component Value Date/Time   CHOL 230 (H) 12/25/2016 1745   TRIG 162 (H) 12/25/2016 1745   HDL 71 12/25/2016 1745   CHOLHDL 3.2 12/25/2016 1745   CHOLHDL 3 08/29/2015 1604   VLDL 16.0 08/29/2015 1604   LDLCALC 127 (H) 12/25/2016 1745      Wt Readings from Last 3 Encounters:  10/30/19 213 lb (96.6 kg)  06/20/18 195 lb 3.2 oz (88.5 kg)  03/14/18 190 lb 2 oz (86.2 kg)      Other studies Reviewed: Additional studies/ records that were reviewed today include: EKG by PCP. Review of the above records demonstrates:  Please see elsewhere in the note.     ASSESSMENT AND PLAN:  CHEST DISCOMFORT:    Her chest discomfort is somewhat atypical but also with some typical features.  The patient had an excellent exercise tolerance.  There was no chest pain.  There was an appropriate level of dyspnea.  There were no arrhythmias, a normal heart rate response and normal BP response.  There were no ischemic ST T wave changes and a normal heart rate recovery. She would also like sublingual nitroglycerin on the outside chance this is coronary artery spasm and I think this is not unreasonable.  I would suggest if the treadmill is normal that she have musculoskeletal evaluation or evaluation for GI etiology.   DYSLIPIDEMIA:  Her LDL is elevated in 2018.  I will repeat a lipid profile when she returns.   MURMUR:   I did not appreciate a systolic murmur at this point.  No further imaging is indicated.  COVID EDUCATION: She is trying to sign the vaccine.   Current medicines are reviewed at length with the patient today.  The patient does not have concerns regarding medicines.  The following changes have been made:   As above  Labs/ tests ordered today include:   Orders Placed This Encounter  Procedures  . Lipid panel  . Exercise Tolerance Test     Disposition:   FU with me as needed.      Signed, Minus Breeding, MD  11/06/2019 5:20 PM    [facility]  HeartCare

## 2019-11-06 ENCOUNTER — Encounter: Payer: Self-pay | Admitting: Cardiology

## 2019-11-06 ENCOUNTER — Ambulatory Visit (INDEPENDENT_AMBULATORY_CARE_PROVIDER_SITE_OTHER): Payer: Medicare Other | Admitting: Cardiology

## 2019-11-06 ENCOUNTER — Other Ambulatory Visit: Payer: Self-pay

## 2019-11-06 VITALS — BP 136/81 | HR 81

## 2019-11-06 DIAGNOSIS — R9431 Abnormal electrocardiogram [ECG] [EKG]: Secondary | ICD-10-CM | POA: Diagnosis not present

## 2019-11-06 DIAGNOSIS — R072 Precordial pain: Secondary | ICD-10-CM

## 2019-11-06 DIAGNOSIS — Z7189 Other specified counseling: Secondary | ICD-10-CM | POA: Diagnosis not present

## 2019-11-06 DIAGNOSIS — E785 Hyperlipidemia, unspecified: Secondary | ICD-10-CM

## 2019-11-06 DIAGNOSIS — R011 Cardiac murmur, unspecified: Secondary | ICD-10-CM | POA: Diagnosis not present

## 2019-11-06 MED ORDER — NITROGLYCERIN 0.4 MG SL SUBL: 0.4000 mg | SUBLINGUAL_TABLET | SUBLINGUAL | 3 refills | Status: DC | PRN

## 2019-11-06 NOTE — Patient Instructions (Signed)
Medication Instructions:  Start Nitroglycerin as needed *If you need a refill on your cardiac medications before your next appointment, please call your pharmacy*  Lab Work: Your physician recommends that you return for lab work when you come for your exercise stress test. If you have labs (blood work) drawn today and your tests are completely normal, you will receive your results only by: Marland Kitchen MyChart Message (if you have MyChart) OR . A paper copy in the mail If you have any lab test that is abnormal or we need to change your treatment, we will call you to review the results.  Testing/Procedures: Your physician has requested that you have an exercise tolerance test. For further information please visit [email or web address]. Please also follow instruction sheet, as given.  You will need a Covid Screening 3 days prior to your exercise tolerance test. You will need to self quarantine after the screening until your procedure.This is a Drive Up Visit at the [facility] [address], [place]. Someone will direct you to the appropriate testing line. Stay in your car and someone will be with you shortly   Follow-Up: At [facility], you and your health needs are our priority.  As part of our continuing mission to provide you with exceptional heart care, we have created designated Provider Care Teams.  These Care Teams include your primary Cardiologist (physician) and Advanced Practice Providers (APPs -  Physician Assistants and Nurse Practitioners) who all work together to provide you with the care you need, when you need it.  Your next appointment:   Follow up as needed

## 2019-11-09 ENCOUNTER — Telehealth: Payer: Self-pay | Admitting: Cardiology

## 2019-11-09 ENCOUNTER — Encounter: Payer: Self-pay | Admitting: Cardiology

## 2019-11-09 NOTE — Telephone Encounter (Signed)
Left message for patient to call and schedule GXT and COVID testing ordered by Dr. Percival Spanish

## 2019-11-11 DIAGNOSIS — E785 Hyperlipidemia, unspecified: Secondary | ICD-10-CM | POA: Diagnosis not present

## 2019-11-12 LAB — LIPID PANEL
Chol/HDL Ratio: 3 ratio (ref 0.0–4.4)
Cholesterol, Total: 213 mg/dL — ABNORMAL HIGH (ref 100–199)
HDL: 71 mg/dL (ref >39)
LDL Chol Calc (NIH): 131 mg/dL — ABNORMAL HIGH (ref 0–99)
Triglycerides: 64 mg/dL (ref 0–149)
VLDL Cholesterol Cal: 11 mg/dL (ref 5–40)

## 2019-11-13 ENCOUNTER — Other Ambulatory Visit (HOSPITAL_COMMUNITY)

## 2019-11-17 ENCOUNTER — Ambulatory Visit (HOSPITAL_COMMUNITY): Admission: RE | Admit: 2019-11-17 | Source: Ambulatory Visit | Attending: Cardiology | Admitting: Cardiology

## 2019-11-20 ENCOUNTER — Other Ambulatory Visit (HOSPITAL_COMMUNITY)

## 2019-11-24 ENCOUNTER — Encounter (HOSPITAL_COMMUNITY)

## 2019-11-25 ENCOUNTER — Telehealth: Payer: Medicare Other | Admitting: Family Medicine

## 2019-11-26 ENCOUNTER — Telehealth (HOSPITAL_COMMUNITY): Payer: Self-pay

## 2019-11-26 NOTE — Telephone Encounter (Signed)
Encounter complete. 

## 2019-11-27 ENCOUNTER — Telehealth (HOSPITAL_COMMUNITY): Payer: Self-pay

## 2019-11-27 ENCOUNTER — Other Ambulatory Visit (HOSPITAL_COMMUNITY)
Admission: RE | Admit: 2019-11-27 | Discharge: 2019-11-27 | Disposition: A | Discharge status: Home / Self Care | Payer: Medicare Other | Source: Ambulatory Visit | Attending: Cardiology | Admitting: Cardiology

## 2019-11-27 DIAGNOSIS — Z01812 Encounter for preprocedural laboratory examination: Secondary | ICD-10-CM | POA: Insufficient documentation

## 2019-11-27 DIAGNOSIS — Z20822 Contact with and (suspected) exposure to covid-19: Secondary | ICD-10-CM | POA: Diagnosis not present

## 2019-11-27 LAB — SARS CORONAVIRUS 2 (TAT 6-24 HRS): SARS Coronavirus 2: NEGATIVE

## 2019-11-27 NOTE — Telephone Encounter (Signed)
Encounter complete. 

## 2019-12-01 ENCOUNTER — Other Ambulatory Visit: Payer: Self-pay

## 2019-12-01 ENCOUNTER — Ambulatory Visit (HOSPITAL_COMMUNITY)
Admission: RE | Admit: 2019-12-01 | Discharge: 2019-12-01 | Disposition: A | Discharge status: Home / Self Care | Payer: Medicare Other | Source: Ambulatory Visit | Attending: Cardiology | Admitting: Cardiology

## 2019-12-01 DIAGNOSIS — R9431 Abnormal electrocardiogram [ECG] [EKG]: Secondary | ICD-10-CM | POA: Insufficient documentation

## 2019-12-01 DIAGNOSIS — R072 Precordial pain: Secondary | ICD-10-CM | POA: Insufficient documentation

## 2019-12-01 LAB — EXERCISE TOLERANCE TEST
Estimated workload: 7.8 METS
Exercise duration (min): 6 min
Exercise duration (sec): 31 s
MPHR: 148 {beats}/min
Peak HR: 162 {beats}/min
Percent HR: 109 %
Rest HR: 91 {beats}/min

## 2020-03-29 DIAGNOSIS — M25561 Pain in right knee: Secondary | ICD-10-CM | POA: Diagnosis not present

## 2020-03-29 DIAGNOSIS — M7661 Achilles tendinitis, right leg: Secondary | ICD-10-CM | POA: Diagnosis not present

## 2020-04-19 DIAGNOSIS — M7661 Achilles tendinitis, right leg: Secondary | ICD-10-CM | POA: Diagnosis not present

## 2020-05-31 DIAGNOSIS — H02401 Unspecified ptosis of right eyelid: Secondary | ICD-10-CM | POA: Diagnosis not present

## 2020-05-31 DIAGNOSIS — H2513 Age-related nuclear cataract, bilateral: Secondary | ICD-10-CM | POA: Diagnosis not present

## 2020-07-10 DIAGNOSIS — Z20822 Contact with and (suspected) exposure to covid-19: Secondary | ICD-10-CM | POA: Diagnosis not present

## 2020-08-09 DIAGNOSIS — Z23 Encounter for immunization: Secondary | ICD-10-CM | POA: Diagnosis not present

## 2020-10-08 DIAGNOSIS — Z20822 Contact with and (suspected) exposure to covid-19: Secondary | ICD-10-CM | POA: Diagnosis not present

## 2020-11-07 ENCOUNTER — Ambulatory Visit (INDEPENDENT_AMBULATORY_CARE_PROVIDER_SITE_OTHER): Payer: Medicare Other | Admitting: Family Medicine

## 2020-11-07 ENCOUNTER — Encounter: Payer: Self-pay | Admitting: Family Medicine

## 2020-11-07 ENCOUNTER — Other Ambulatory Visit: Payer: Self-pay

## 2020-11-07 VITALS — BP 122/72 | HR 75 | Temp 98.5°F | Ht 66.0 in | Wt 206.5 lb

## 2020-11-07 DIAGNOSIS — Z8639 Personal history of other endocrine, nutritional and metabolic disease: Secondary | ICD-10-CM

## 2020-11-07 DIAGNOSIS — M25512 Pain in left shoulder: Secondary | ICD-10-CM

## 2020-11-07 DIAGNOSIS — E782 Mixed hyperlipidemia: Secondary | ICD-10-CM | POA: Diagnosis not present

## 2020-11-07 DIAGNOSIS — G4709 Other insomnia: Secondary | ICD-10-CM | POA: Diagnosis not present

## 2020-11-07 DIAGNOSIS — H53143 Visual discomfort, bilateral: Secondary | ICD-10-CM | POA: Insufficient documentation

## 2020-11-07 DIAGNOSIS — R32 Unspecified urinary incontinence: Secondary | ICD-10-CM

## 2020-11-07 DIAGNOSIS — N816 Rectocele: Secondary | ICD-10-CM | POA: Diagnosis not present

## 2020-11-07 DIAGNOSIS — K219 Gastro-esophageal reflux disease without esophagitis: Secondary | ICD-10-CM | POA: Diagnosis not present

## 2020-11-07 DIAGNOSIS — G8929 Other chronic pain: Secondary | ICD-10-CM

## 2020-11-07 DIAGNOSIS — R351 Nocturia: Secondary | ICD-10-CM

## 2020-11-07 DIAGNOSIS — H269 Unspecified cataract: Secondary | ICD-10-CM | POA: Insufficient documentation

## 2020-11-07 HISTORY — DX: Unspecified cataract: H26.9

## 2020-11-07 LAB — COMPREHENSIVE METABOLIC PANEL
ALT: 13 U/L (ref 0–35)
AST: 15 U/L (ref 0–37)
Albumin: 4 g/dL (ref 3.5–5.2)
Alkaline Phosphatase: 53 U/L (ref 39–117)
BUN: 14 mg/dL (ref 6–23)
CO2: 27 mEq/L (ref 19–32)
Calcium: 9.5 mg/dL (ref 8.4–10.5)
Chloride: 104 mEq/L (ref 96–112)
Creatinine, Ser: 1 mg/dL (ref 0.40–1.20)
GFR: 56.07 mL/min — ABNORMAL LOW (ref >60.00)
Glucose, Bld: 87 mg/dL (ref 70–99)
Potassium: 4.7 mEq/L (ref 3.5–5.1)
Sodium: 138 mEq/L (ref 135–145)
Total Bilirubin: 0.5 mg/dL (ref 0.2–1.2)
Total Protein: 7 g/dL (ref 6.0–8.3)

## 2020-11-07 LAB — LIPID PANEL
Cholesterol: 182 mg/dL (ref 0–200)
HDL: 67.4 mg/dL (ref >39.00)
LDL Cholesterol: 101 mg/dL — ABNORMAL HIGH (ref 0–99)
NonHDL: 114.99
Total CHOL/HDL Ratio: 3
Triglycerides: 69 mg/dL (ref 0.0–149.0)
VLDL: 13.8 mg/dL (ref 0.0–40.0)

## 2020-11-07 LAB — TSH: TSH: 2.26 u[IU]/mL (ref 0.35–4.50)

## 2020-11-07 MED ORDER — OMEPRAZOLE 20 MG PO CPDR: 20.0000 mg | DELAYED_RELEASE_CAPSULE | Freq: Every day | ORAL | 3 refills | Status: DC

## 2020-11-07 MED ORDER — ESZOPICLONE 3 MG PO TABS: 3.0000 mg | ORAL_TABLET | Freq: Every day | ORAL | 0 refills | Status: DC

## 2020-11-07 NOTE — Assessment & Plan Note (Signed)
Recurrence of old injury. Treating with acupuncture. Did not have time to examine today. Will reassess if not improving at next OV

## 2020-11-07 NOTE — Assessment & Plan Note (Signed)
Etiology 2/2 pain, GERD, and nocturia. Rare prn use of lunesta. Discussed continuing but also focusing on primary reasons for sleep disruption.

## 2020-11-07 NOTE — Assessment & Plan Note (Signed)
Suspect pelvic floor dysfunction - pt notes rectocele and incontinence of urine as well as frequency. She notes she is not good a kegels and discussed Pelvic PT referral for further diagnosis and treatment and she is willing to try. Hoping that treating urinary symptoms will improve sleep

## 2020-11-07 NOTE — Assessment & Plan Note (Signed)
Declined Statin but did have normal Coronary Calcium score in 2018. Discussed repeat lipids and continuing red yeast rice.

## 2020-11-07 NOTE — Assessment & Plan Note (Addendum)
Treats with papaya enzymes. And other supplements. Encouraged 8 week trial of PPI to see symptoms will resolve. Discussed GI referral if persisting/quickly return. She will take omeprazole 20 mg daily

## 2020-11-07 NOTE — Patient Instructions (Signed)
#  Heartburn - Take Omeprazole 20 mg daily - if improved after 2 weeks, continue for 8 weeks - after 8 weeks try stopping  #Urinary symptoms - referral to the pelvic floor therapists  Return in 8 weeks

## 2020-11-07 NOTE — Progress Notes (Signed)
Subjective:     Shirley Bean is a 75 y.o. female presenting for Establish Care and Insomnia (Wants Lunesta- 3mg  PRN )     HPI  #Chronic pain - hx of tendon injury >20 years ago - went to acupuncture in the past - now her acupuncturist cannot seem to treat her symptoms   #Insomnia - On lunesta which was last filled 2019 - will rarely take this medication  - Pain - recently with left shoulder 2 weeks ago --- does have hx of pain - heartburn occasionally - and this can keep her up - has treated with nexium in the past - effective - years ago, but saw that it could cause esophageal cancer - nocturia - 1/3 of the time, every hour - 4-5 hours is great for her - went on spiritual retreat with 8 hours overnight - husband notes that she snores - husband has not noticed that she stops breathing  Urinary symptoms - frequency, incontinence, drinks 2 quarts of water per day,  - will get "throbbing" symptoms at night  #Dizzy/nausea - saw an eye specialists who said it was not related to her eyes - though she has cataracts   Review of Systems   Social History   Tobacco Use  Smoking Status Never Smoker  Smokeless Tobacco Never Used        Objective:    BP Readings from Last 3 Encounters:  11/07/20 122/72  11/06/19 136/81  10/30/19 132/79   Wt Readings from Last 3 Encounters:  11/07/20 206 lb 8 oz (93.7 kg)  10/30/19 213 lb (96.6 kg)  06/20/18 195 lb 3.2 oz (88.5 kg)    BP 122/72   Pulse 75   Temp 98.5 F (36.9 C) (Temporal)   Ht 5\' 6"  (1.676 m)   Wt 206 lb 8 oz (93.7 kg)   SpO2 99%   BMI 33.33 kg/m    Physical Exam Constitutional:      General: She is not in acute distress.    Appearance: She is well-developed. She is not diaphoretic.  HENT:     Right Ear: External ear normal.     Left Ear: External ear normal.  Eyes:     Comments: Wearing sunglasses  Cardiovascular:     Rate and Rhythm: Normal rate and regular rhythm.  Pulmonary:      Effort: Pulmonary effort is normal. No respiratory distress.     Breath sounds: Normal breath sounds. No wheezing.  Musculoskeletal:     Cervical back: Neck supple.  Skin:    General: Skin is warm and dry.     Capillary Refill: Capillary refill takes less than 2 seconds.  Neurological:     Mental Status: She is alert. Mental status is at baseline.  Psychiatric:        Mood and Affect: Mood normal.        Behavior: Behavior normal.       The 10-year ASCVD risk score [name], et al., 2013) is: 11.9%   Values used to calculate the score:     Age: 70 years     Sex: Female     Is Non-Hispanic African American: No     Diabetic: No     Tobacco smoker: No     Systolic Blood Pressure: 884 mmHg     Is BP treated: No     HDL Cholesterol: 71 mg/dL     Total Cholesterol: 213 mg/dL  Had a Coronary Ca score of  0 in 2018     Assessment & Plan:   Problem List Items Addressed This Visit      Digestive   GERD    Treats with papaya enzymes. And other supplements. Encouraged 8 week trial of PPI to see symptoms will resolve. Discussed GI referral if persisting/quickly return. She will take omeprazole 20 mg daily      Relevant Medications   omeprazole (PRILOSEC) 20 MG capsule   Other Relevant Orders   Comprehensive metabolic panel   Rectocele   Relevant Orders   Ambulatory referral to Physical Therapy     Other   INSOMNIA    Etiology 2/2 pain, GERD, and nocturia. Rare prn use of lunesta. Discussed continuing but also focusing on primary reasons for sleep disruption.       Relevant Medications   Eszopiclone (ESZOPICLONE) 3 MG TABS   H/O thyroid nodule   Relevant Orders   TSH   Hyperlipidemia, mixed - Primary    Declined Statin but did have normal Coronary Calcium score in 2018. Discussed repeat lipids and continuing red yeast rice.       Relevant Orders   Lipid panel   Left shoulder pain    Recurrence of old injury. Treating with acupuncture. Did not have time to  examine today. Will reassess if not improving at next OV      Urinary incontinence   Relevant Orders   Ambulatory referral to Physical Therapy   Nocturia    Suspect pelvic floor dysfunction - pt notes rectocele and incontinence of urine as well as frequency. She notes she is not good a kegels and discussed Pelvic PT referral for further diagnosis and treatment and she is willing to try. Hoping that treating urinary symptoms will improve sleep      Relevant Orders   Ambulatory referral to Physical Therapy       Return in about 2 months (around 01/05/2021).  Lesleigh Noe, MD  This visit occurred during the SARS-CoV-2 public health emergency.  Safety protocols were in place, including screening questions prior to the visit, additional usage of staff PPE, and extensive cleaning of exam room while observing appropriate contact time as indicated for disinfecting solutions.

## 2020-11-11 ENCOUNTER — Encounter: Payer: Self-pay | Admitting: Family Medicine

## 2020-11-17 DIAGNOSIS — Z1231 Encounter for screening mammogram for malignant neoplasm of breast: Secondary | ICD-10-CM | POA: Diagnosis not present

## 2020-11-17 LAB — HM MAMMOGRAPHY

## 2020-11-29 ENCOUNTER — Telehealth: Payer: Self-pay | Admitting: Family Medicine

## 2020-11-29 NOTE — Telephone Encounter (Signed)
See mychart message to the patient. Referral faxed over as requested. Patient advised through [facility]

## 2020-11-29 NOTE — Telephone Encounter (Signed)
Pt called in wanted to know about getting the referral for [facility] in [place] [phone]

## 2020-12-07 ENCOUNTER — Encounter: Payer: Self-pay | Admitting: Family Medicine

## 2020-12-27 DIAGNOSIS — M6289 Other specified disorders of muscle: Secondary | ICD-10-CM | POA: Diagnosis not present

## 2020-12-27 DIAGNOSIS — M6281 Muscle weakness (generalized): Secondary | ICD-10-CM | POA: Diagnosis not present

## 2020-12-27 DIAGNOSIS — R351 Nocturia: Secondary | ICD-10-CM | POA: Diagnosis not present

## 2020-12-27 DIAGNOSIS — N3941 Urge incontinence: Secondary | ICD-10-CM | POA: Diagnosis not present

## 2021-01-11 DIAGNOSIS — R351 Nocturia: Secondary | ICD-10-CM | POA: Diagnosis not present

## 2021-01-11 DIAGNOSIS — N3941 Urge incontinence: Secondary | ICD-10-CM | POA: Diagnosis not present

## 2021-01-11 DIAGNOSIS — M6289 Other specified disorders of muscle: Secondary | ICD-10-CM | POA: Diagnosis not present

## 2021-01-11 DIAGNOSIS — M6281 Muscle weakness (generalized): Secondary | ICD-10-CM | POA: Diagnosis not present

## 2021-01-12 ENCOUNTER — Other Ambulatory Visit: Payer: Self-pay

## 2021-01-12 ENCOUNTER — Ambulatory Visit (INDEPENDENT_AMBULATORY_CARE_PROVIDER_SITE_OTHER): Payer: Medicare Other | Admitting: Family Medicine

## 2021-01-12 VITALS — BP 124/60 | HR 88 | Temp 99.6°F | Ht 66.0 in | Wt 202.5 lb

## 2021-01-12 DIAGNOSIS — H53143 Visual discomfort, bilateral: Secondary | ICD-10-CM

## 2021-01-12 DIAGNOSIS — R42 Dizziness and giddiness: Secondary | ICD-10-CM | POA: Diagnosis not present

## 2021-01-12 DIAGNOSIS — K219 Gastro-esophageal reflux disease without esophagitis: Secondary | ICD-10-CM | POA: Diagnosis not present

## 2021-01-12 LAB — BASIC METABOLIC PANEL
BUN: 14 mg/dL (ref 6–23)
CO2: 27 mEq/L (ref 19–32)
Calcium: 9.3 mg/dL (ref 8.4–10.5)
Chloride: 104 mEq/L (ref 96–112)
Creatinine, Ser: 0.97 mg/dL (ref 0.40–1.20)
GFR: 58.09 mL/min — ABNORMAL LOW (ref >60.00)
Glucose, Bld: 94 mg/dL (ref 70–99)
Potassium: 5.1 mEq/L (ref 3.5–5.1)
Sodium: 138 mEq/L (ref 135–145)

## 2021-01-12 NOTE — Assessment & Plan Note (Signed)
Reviewed Neurology note 2019 and pt declined MRI brain. Discussed this could be a next possible step to rule out signs of stroke or mass. She felt that at this time she would want to pursue treatment for any brain lesion so declined diagnostic work-up. Discussed it will still leave symptoms in the unknown and she understood. She will also reach out Dr. Schuyler Amor as cataract surgery may help her symptoms but unclear.

## 2021-01-12 NOTE — Progress Notes (Signed)
Subjective:     Shirley Bean is a 75 y.o. female presenting for Follow-up (2 months- GERD )     HPI   #GERD - significant improvement on omeprazole - stopped after 6 weeks - using H2 blocker 1-2 times a day - doing natural remedies including supplements from a book - magnesium, b6, b12, vit d, slippery helm, aloe, licorice  Stopped when walking in the woods and dizziness when she bent down to look at her shoes - afterward had to walk and would sit, drink lots of water, had to keep having rest/sit   #Photophobia/dizziness - sensitivity  - has not had any work-up - >10 years - eye specialist - everything is normal - Neurologist - no imaging or blood work to her knowledge was  - Cardiology - EKG, stress test was normal  - will get dizzy and nauseous when standing still  - does not occur with transition sitting to standing - worse with a standing but this resolves with walking  - has to sit down if standing for a long time  I suppose to go back for cataract surgery with the eye specialist - saw Dr. Katy Fitch  - quick round vision changes  Review of Systems   Social History   Tobacco Use  Smoking Status Never Smoker  Smokeless Tobacco Never Used        Objective:    BP Readings from Last 3 Encounters:  01/12/21 124/60  11/07/20 122/72  11/06/19 136/81   Wt Readings from Last 3 Encounters:  01/12/21 202 lb 8 oz (91.9 kg)  11/07/20 206 lb 8 oz (93.7 kg)  10/30/19 213 lb (96.6 kg)    BP 124/60   Pulse 88   Temp 99.6 F (37.6 C) (Temporal)   Ht 5\' 6"  (1.676 m)   Wt 202 lb 8 oz (91.9 kg)   SpO2 97%   BMI 32.68 kg/m    Physical Exam Constitutional:      General: She is not in acute distress.    Appearance: She is well-developed. She is not diaphoretic.  HENT:     Right Ear: External ear normal.     Left Ear: External ear normal.     Nose: Nose normal.  Eyes:     Conjunctiva/sclera: Conjunctivae normal.  Cardiovascular:     Rate and Rhythm:  Normal rate.  Pulmonary:     Effort: Pulmonary effort is normal.  Musculoskeletal:     Cervical back: Neck supple.  Skin:    General: Skin is warm and dry.     Capillary Refill: Capillary refill takes less than 2 seconds.  Neurological:     Mental Status: She is alert. Mental status is at baseline.  Psychiatric:        Mood and Affect: Mood normal.        Behavior: Behavior normal.           Assessment & Plan:   Problem List Items Addressed This Visit      Digestive   GERD - Primary    Stable. Improved initially with omeprazole but stopped due to concern for side effects. Now treating with several vitamins and supplements (Mg, B6, B12, Vit D, slippery helm, aloe, licorice). Discussed licorice can raise potassium so will check electrolytes. Otherwise can continue supplements      Relevant Orders   Basic metabolic panel     Other   Photophobia of both eyes    Reviewed Neurology note 2019 and pt  declined MRI brain. Discussed this could be a next possible step to rule out signs of stroke or mass. She felt that at this time she would want to pursue treatment for any brain lesion so declined diagnostic work-up. Discussed it will still leave symptoms in the unknown and she understood. She will also reach out Dr. Schuyler Amor as cataract surgery may help her symptoms but unclear.       Dizziness of unknown etiology    Dizziness with photophobia and with prolonged standing. Etiology unclear. Normal stress test recently. Did have orthostatic symptoms but these resolved. Declined MRI (see photophobia plan). Cont sunglasses and precautions with prolonged standing.           Return in about 6 months (around 07/14/2021).  Lesleigh Noe, MD  This visit occurred during the SARS-CoV-2 public health emergency.  Safety protocols were in place, including screening questions prior to the visit, additional usage of staff PPE, and extensive cleaning of exam room while observing appropriate  contact time as indicated for disinfecting solutions.

## 2021-01-12 NOTE — Patient Instructions (Addendum)
#   Photophobia and dizziness - at this point could consider MRI to look for brain related cause - but we decided not to do this today  #Reflux - continue the supplements as they seem to help   Call the eye doctor

## 2021-01-12 NOTE — Assessment & Plan Note (Signed)
Dizziness with photophobia and with prolonged standing. Etiology unclear. Normal stress test recently. Did have orthostatic symptoms but these resolved. Declined MRI (see photophobia plan). Cont sunglasses and precautions with prolonged standing.

## 2021-01-12 NOTE — Assessment & Plan Note (Signed)
Stable. Improved initially with omeprazole but stopped due to concern for side effects. Now treating with several vitamins and supplements (Mg, B6, B12, Vit D, slippery helm, aloe, licorice). Discussed licorice can raise potassium so will check electrolytes. Otherwise can continue supplements

## 2021-01-22 DIAGNOSIS — Z23 Encounter for immunization: Secondary | ICD-10-CM | POA: Diagnosis not present

## 2021-01-26 DIAGNOSIS — M6281 Muscle weakness (generalized): Secondary | ICD-10-CM | POA: Diagnosis not present

## 2021-01-26 DIAGNOSIS — N3941 Urge incontinence: Secondary | ICD-10-CM | POA: Diagnosis not present

## 2021-01-26 DIAGNOSIS — M6289 Other specified disorders of muscle: Secondary | ICD-10-CM | POA: Diagnosis not present

## 2021-01-26 DIAGNOSIS — R351 Nocturia: Secondary | ICD-10-CM | POA: Diagnosis not present

## 2021-02-15 DIAGNOSIS — Z20822 Contact with and (suspected) exposure to covid-19: Secondary | ICD-10-CM | POA: Diagnosis not present

## 2021-02-15 DIAGNOSIS — Z03818 Encounter for observation for suspected exposure to other biological agents ruled out: Secondary | ICD-10-CM | POA: Diagnosis not present

## 2021-03-02 ENCOUNTER — Telehealth: Payer: Self-pay | Admitting: Family Medicine

## 2021-03-02 NOTE — Telephone Encounter (Signed)
LVM for pt to rtn my call to schedule AWV with [facility].  

## 2021-03-22 ENCOUNTER — Encounter: Payer: Self-pay | Admitting: Dermatology

## 2021-03-22 ENCOUNTER — Ambulatory Visit (INDEPENDENT_AMBULATORY_CARE_PROVIDER_SITE_OTHER): Payer: Medicare Other | Admitting: Dermatology

## 2021-03-22 ENCOUNTER — Other Ambulatory Visit: Payer: Self-pay

## 2021-03-22 DIAGNOSIS — L570 Actinic keratosis: Secondary | ICD-10-CM | POA: Diagnosis not present

## 2021-03-22 DIAGNOSIS — Z86018 Personal history of other benign neoplasm: Secondary | ICD-10-CM

## 2021-03-22 DIAGNOSIS — Z1283 Encounter for screening for malignant neoplasm of skin: Secondary | ICD-10-CM

## 2021-04-03 ENCOUNTER — Encounter: Payer: Self-pay | Admitting: Dermatology

## 2021-04-03 NOTE — Progress Notes (Signed)
   Follow-Up Visit   Subjective  Shirley Bean is a 75 y.o. female who presents for the following: Skin Problem (Patient here today for lesion on right shoulder x years, no bleeding, no pain. Personal history of atypical mole. No personal history of melanoma or non mole skin cancer. No family history of atypical moles, melanoma or non mole skin cancer. ).  Skin check, possible new spot on right shoulder Location:  Duration:  Quality:  Associated Signs/Symptoms: Modifying Factors:  Severity:  Timing: Context:   Objective  Well appearing patient in no apparent distress; mood and affect are within normal limits. Waist Up Waist up exam today no signs of atypical moles, melanoma or non mole skin cancer.   Right Shoulder - Posterior Slightly inflamed pink 9 mm gritty crust    All skin waist up examined.  Areas beneath undergarments not fully examined.   Assessment & Plan    Skin exam for malignant neoplasm Waist Up  Yearly skin check  Lichenoid actinic keratosis Right Shoulder - Posterior  Destruction of lesion - Right Shoulder - Posterior Complexity: simple   Destruction method: cryotherapy   Informed consent: discussed and consent obtained   Timeout:  patient name, date of birth, surgical site, and procedure verified Lesion destroyed using liquid nitrogen: Yes   Cryotherapy cycles:  3 Outcome: patient tolerated procedure well with no complications   Post-procedure details: wound care instructions given        I, Lavonna Monarch, MD, have reviewed all documentation for this visit.  The documentation on 04/03/21 for the exam, diagnosis, procedures, and orders are all accurate and complete.

## 2021-04-10 DIAGNOSIS — H02401 Unspecified ptosis of right eyelid: Secondary | ICD-10-CM | POA: Diagnosis not present

## 2021-04-10 DIAGNOSIS — H2513 Age-related nuclear cataract, bilateral: Secondary | ICD-10-CM | POA: Diagnosis not present

## 2021-05-08 ENCOUNTER — Encounter: Payer: Self-pay | Admitting: Family Medicine

## 2021-05-19 ENCOUNTER — Other Ambulatory Visit: Payer: Self-pay | Admitting: Family Medicine

## 2021-05-19 NOTE — Telephone Encounter (Signed)
Last filled by historical provider Last OV: 01/12/21 No future appts scheduled

## 2021-07-06 DIAGNOSIS — Z23 Encounter for immunization: Secondary | ICD-10-CM | POA: Diagnosis not present

## 2021-07-25 DIAGNOSIS — Z23 Encounter for immunization: Secondary | ICD-10-CM | POA: Diagnosis not present

## 2021-08-10 IMAGING — DX DG CHEST 2V
2 series · 2 of 2 positions shown · non-contrast
Comparison: 12/25/2016

CLINICAL DATA: Intermittent chest tightness, chest pain, history
GERD

EXAM:
CHEST - 2 VIEW

[chest pa]
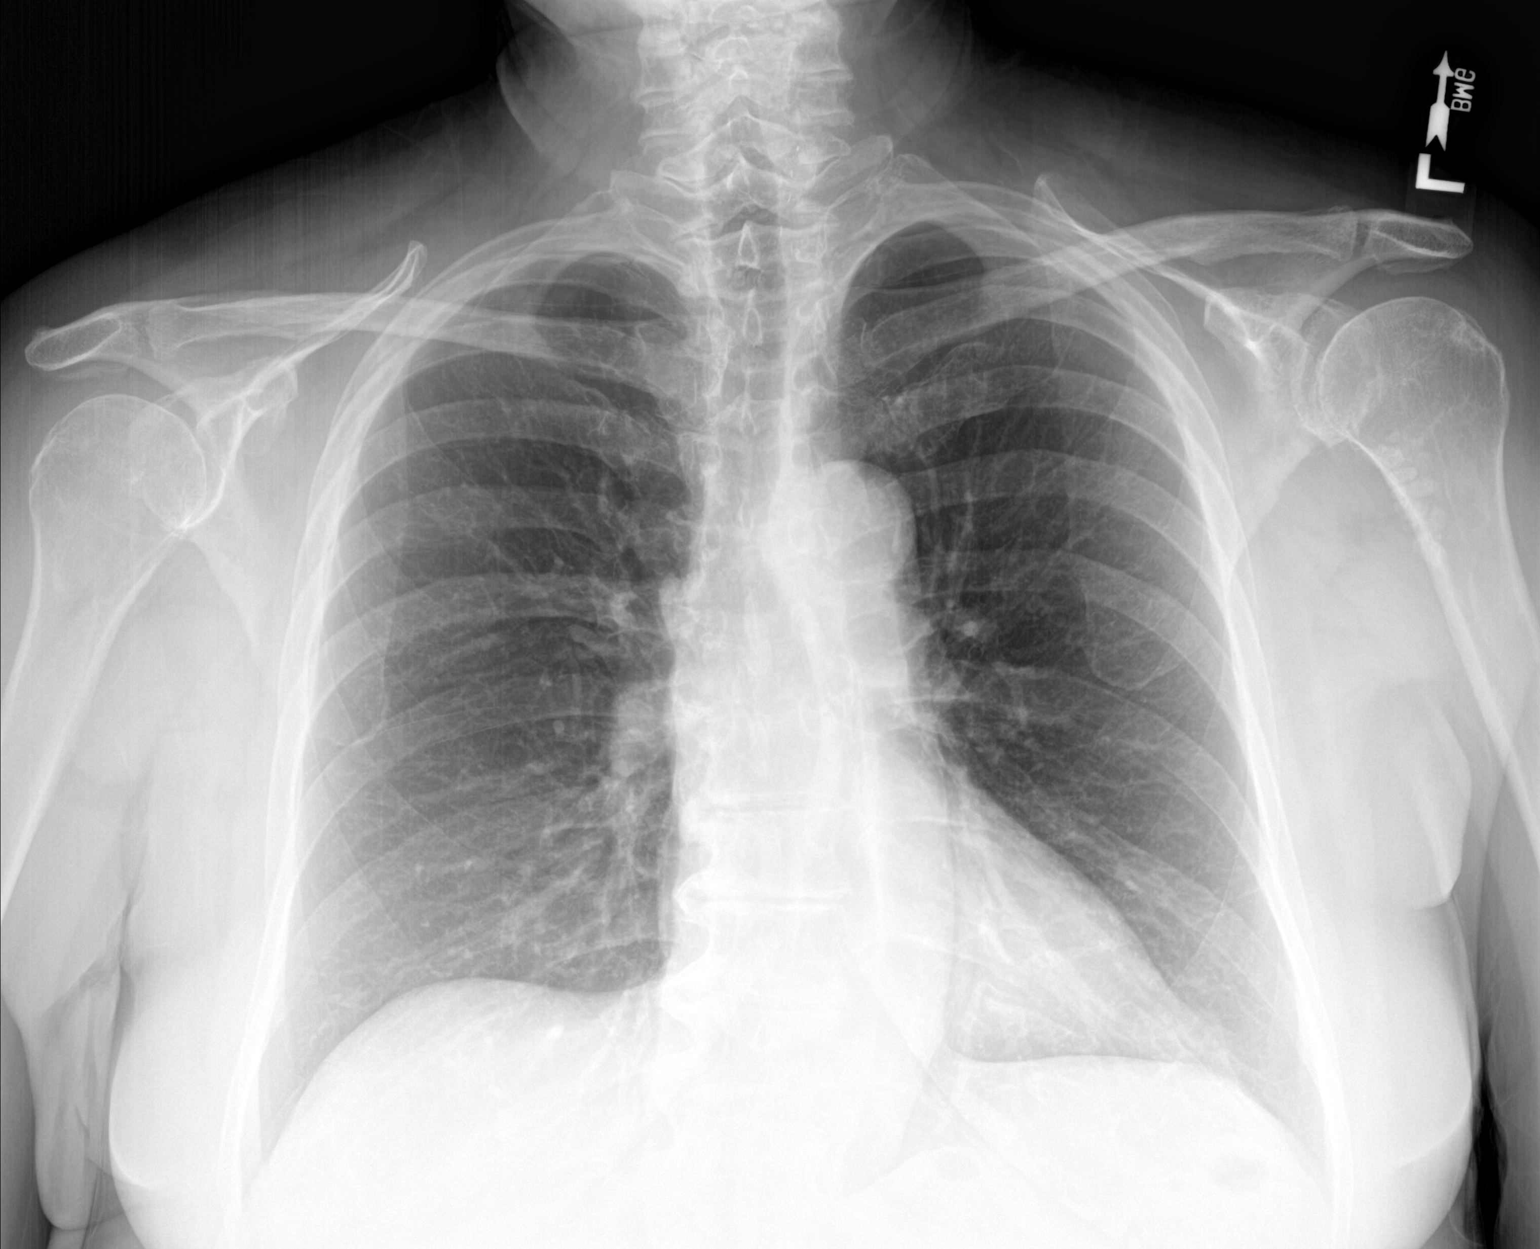

[chest lat]
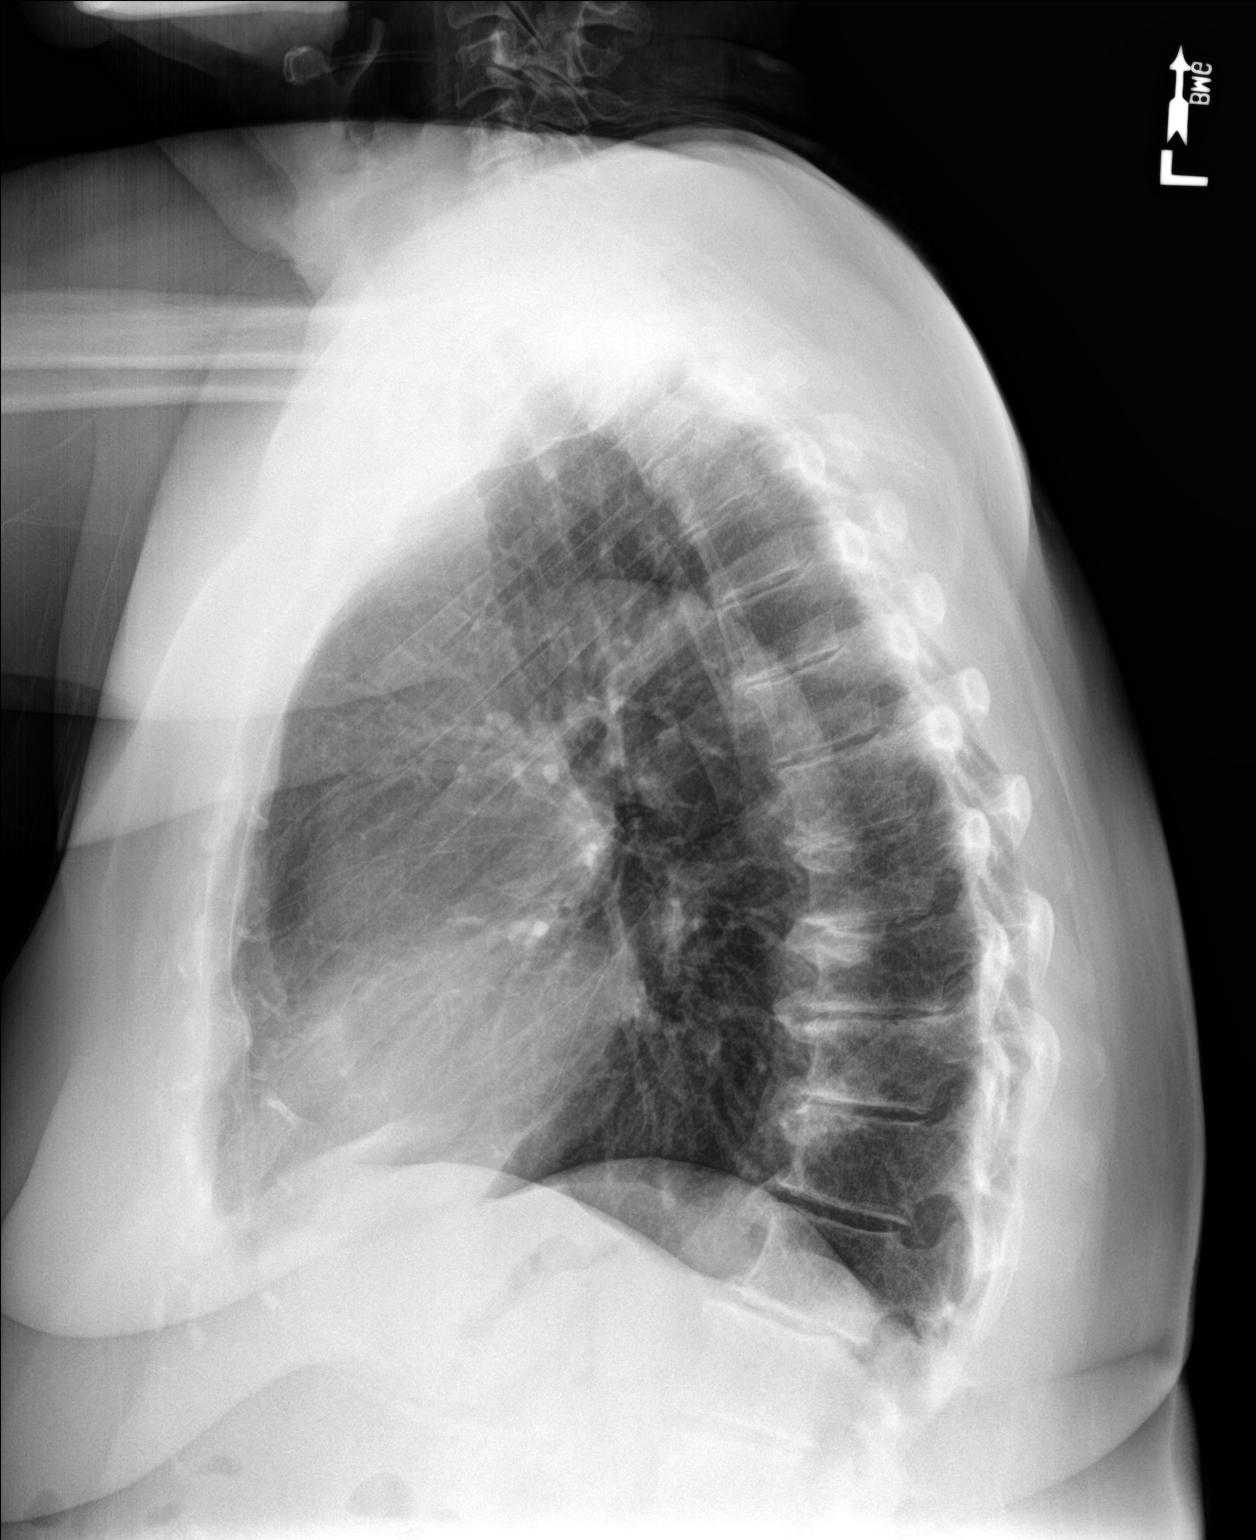

[2 of 2 positions shown; findings below may reference images not displayed]

FINDINGS: Normal heart size, mediastinal contours, and pulmonary vascularity.

Atherosclerotic calcification aorta.

Lungs clear.

No infiltrate, pleural effusion or pneumothorax.

Multiple small calcifications adjacent to the proximal LEFT humerus
unchanged question small calcified loose bodies.

Diffuse narrowing of the LEFT glenohumeral joint.

Scattered endplate spur formation thoracic spine.
IMPRESSION: No acute abnormalities.

## 2021-09-29 DIAGNOSIS — Z20822 Contact with and (suspected) exposure to covid-19: Secondary | ICD-10-CM | POA: Diagnosis not present

## 2021-09-29 DIAGNOSIS — Z03818 Encounter for observation for suspected exposure to other biological agents ruled out: Secondary | ICD-10-CM | POA: Diagnosis not present

## 2021-10-05 ENCOUNTER — Telehealth: Payer: Self-pay | Admitting: Family Medicine

## 2021-10-05 NOTE — Telephone Encounter (Signed)
LVM for pt to rtn my call to schedule AWV with [facility].  

## 2021-11-17 ENCOUNTER — Telehealth: Payer: Self-pay | Admitting: Family Medicine

## 2021-11-23 NOTE — Telephone Encounter (Signed)
Mrs. Shirley Bean called in and asked about her medication. Related the message and made her an apt for 3/6 @1200 

## 2021-12-04 ENCOUNTER — Encounter: Payer: Self-pay | Admitting: Family Medicine

## 2021-12-04 ENCOUNTER — Ambulatory Visit (INDEPENDENT_AMBULATORY_CARE_PROVIDER_SITE_OTHER): Payer: Medicare Other | Admitting: Family Medicine

## 2021-12-04 ENCOUNTER — Other Ambulatory Visit: Payer: Self-pay

## 2021-12-04 VITALS — BP 122/70 | HR 69 | Temp 97.8°F | Ht 66.0 in | Wt 197.5 lb

## 2021-12-04 DIAGNOSIS — N1831 Chronic kidney disease, stage 3a: Secondary | ICD-10-CM

## 2021-12-04 DIAGNOSIS — K219 Gastro-esophageal reflux disease without esophagitis: Secondary | ICD-10-CM

## 2021-12-04 DIAGNOSIS — Z8639 Personal history of other endocrine, nutritional and metabolic disease: Secondary | ICD-10-CM | POA: Diagnosis not present

## 2021-12-04 DIAGNOSIS — R351 Nocturia: Secondary | ICD-10-CM

## 2021-12-04 DIAGNOSIS — N183 Chronic kidney disease, stage 3 unspecified: Secondary | ICD-10-CM

## 2021-12-04 DIAGNOSIS — E782 Mixed hyperlipidemia: Secondary | ICD-10-CM | POA: Diagnosis not present

## 2021-12-04 DIAGNOSIS — G4709 Other insomnia: Secondary | ICD-10-CM | POA: Diagnosis not present

## 2021-12-04 HISTORY — DX: Chronic kidney disease, stage 3 unspecified: N18.30

## 2021-12-04 LAB — COMPREHENSIVE METABOLIC PANEL
ALT: 14 U/L (ref 0–35)
AST: 18 U/L (ref 0–37)
Albumin: 4.2 g/dL (ref 3.5–5.2)
Alkaline Phosphatase: 58 U/L (ref 39–117)
BUN: 13 mg/dL (ref 6–23)
CO2: 27 mEq/L (ref 19–32)
Calcium: 9.2 mg/dL (ref 8.4–10.5)
Chloride: 103 mEq/L (ref 96–112)
Creatinine, Ser: 0.96 mg/dL (ref 0.40–1.20)
GFR: 58.45 mL/min — ABNORMAL LOW (ref >60.00)
Glucose, Bld: 75 mg/dL (ref 70–99)
Potassium: 4.2 mEq/L (ref 3.5–5.1)
Sodium: 140 mEq/L (ref 135–145)
Total Bilirubin: 0.5 mg/dL (ref 0.2–1.2)
Total Protein: 7.2 g/dL (ref 6.0–8.3)

## 2021-12-04 LAB — CBC
HCT: 44 % (ref 36.0–46.0)
Hemoglobin: 14.6 g/dL (ref 12.0–15.0)
MCHC: 33.1 g/dL (ref 30.0–36.0)
MCV: 93.3 fl (ref 78.0–100.0)
Platelets: 154 10*3/uL (ref 150.0–400.0)
RBC: 4.72 Mil/uL (ref 3.87–5.11)
RDW: 14.4 % (ref 11.5–15.5)
WBC: 6.5 10*3/uL (ref 4.0–10.5)

## 2021-12-04 LAB — LIPID PANEL
Cholesterol: 205 mg/dL — ABNORMAL HIGH (ref 0–200)
HDL: 65.1 mg/dL (ref >39.00)
LDL Cholesterol: 119 mg/dL — ABNORMAL HIGH (ref 0–99)
NonHDL: 139.87
Total CHOL/HDL Ratio: 3
Triglycerides: 102 mg/dL (ref 0.0–149.0)
VLDL: 20.4 mg/dL (ref 0.0–40.0)

## 2021-12-04 LAB — TSH: TSH: 2.11 u[IU]/mL (ref 0.35–5.50)

## 2021-12-04 MED ORDER — FAMOTIDINE 20 MG PO TABS: 20.0000 mg | ORAL_TABLET | Freq: Two times a day (BID) | ORAL | 1 refills | Status: AC

## 2021-12-04 MED ORDER — ESZOPICLONE 3 MG PO TABS: ORAL_TABLET | ORAL | 1 refills | Status: DC

## 2021-12-04 NOTE — Progress Notes (Signed)
? ?Subjective:  ? ?  ?Shirley Bean is a 76 y.o. female presenting for Medication Refill ?  ? ? ?Medication Refill ? ? ? ?#Heart burn ?- seems worse ?- now getting some daytime symptoms ?- will get symptoms with movement ?- treatment: alternative treatments typically, will take pepcid x 2 if nothing works  ?- taking Pepcid 1-2 times per day for the last 2 weeks ?- symptoms are still persisting  ?- tried the PPI but this made her dizzy ?- already has dizzy spells and these worsened  ?- did try this but worsening symptoms ?- some reflux - not often ? ?#Insomnia ?- all over the place ?- using herbals ?- minimizes kava ?- has sleeping pills and uses <60 per year ?- if she takes the medication too  many days  ? ?Review of Systems ? ? ?Social History  ? ?Tobacco Use  ?Smoking Status Never  ?Smokeless Tobacco Never  ? ? ? ?   ?Objective:  ?  ?BP Readings from Last 3 Encounters:  ?12/04/21 122/70  ?01/12/21 124/60  ?11/07/20 122/72  ? ?Wt Readings from Last 3 Encounters:  ?12/04/21 197 lb 8 oz (89.6 kg)  ?01/12/21 202 lb 8 oz (91.9 kg)  ?11/07/20 206 lb 8 oz (93.7 kg)  ? ? ?BP 122/70   Pulse 69   Temp 97.8 ?F (36.6 ?C) (Oral)   Ht '5\' 6"'$  (1.676 m)   Wt 197 lb 8 oz (89.6 kg)   SpO2 98%   BMI 31.88 kg/m?  ? ? ?Physical Exam ?Constitutional:   ?   General: She is not in acute distress. ?   Appearance: She is well-developed. She is not diaphoretic.  ?HENT:  ?   Right Ear: External ear normal.  ?   Left Ear: External ear normal.  ?   Nose: Nose normal.  ?Eyes:  ?   Comments: Wearing large sunglasses  ?Cardiovascular:  ?   Rate and Rhythm: Normal rate and regular rhythm.  ?   Heart sounds: No murmur heard. ?Pulmonary:  ?   Effort: Pulmonary effort is normal. No respiratory distress.  ?   Breath sounds: Normal breath sounds. No wheezing.  ?Musculoskeletal:  ?   Cervical back: Neck supple.  ?Skin: ?   General: Skin is warm and dry.  ?   Capillary Refill: Capillary refill takes less than 2 seconds.  ?Neurological:  ?    Mental Status: She is alert. Mental status is at baseline.  ?Psychiatric:     ?   Mood and Affect: Mood normal.     ?   Behavior: Behavior normal.  ? ? ? ? ? ?   ?Assessment & Plan:  ? ?Problem List Items Addressed This Visit   ? ?  ? Digestive  ? GERD  ?  Long discussion with patient about risk of this and benefits of treatment of ongoing acid reflux.  She will do trial of Pepcid 20 mg twice daily for up to 6 to 8 weeks.  If ineffective would recommend GI.  Of note she did not tolerate a PPI. ?  ?  ? Relevant Medications  ? famotidine (PEPCID) 20 MG tablet  ? Other Relevant Orders  ? Comprehensive metabolic panel  ? CBC  ?  ? Genitourinary  ? CKD (chronic kidney disease) stage 3, GFR 30-59 ml/min (HCC)  ?  Discussed minimally decreased kidney function we will repeat labs today.  Blood pressure already under good control.  Advised against taking NSAIDs.  Advised checking her many supplements to see if any of these could hurt the kidneys.  In review of literature found that ? ?[place] herbals with aristocholic acid   ? ?Can impact kidney function we will send my chart to let patient know to avoid this herb. ?  ?  ?  ? Other  ? INSOMNIA - Primary  ?  Rare use of eszopiclone 3 mg, number 60/year.  Continue as needed herbs. ?  ?  ? Relevant Medications  ? Eszopiclone 3 MG TABS  ? H/O thyroid nodule  ? Relevant Orders  ? TSH  ? Hyperlipidemia, mixed  ? Relevant Orders  ? Lipid panel  ? Nocturia  ?  Improved on d-mannose. Continue supplememt ?  ?  ? ? ? ?Return in about 6 months (around 06/06/2022). ? ?Lesleigh Noe, MD ? ?This visit occurred during the SARS-CoV-2 public health emergency.  Safety protocols were in place, including screening questions prior to the visit, additional usage of staff PPE, and extensive cleaning of exam room while observing appropriate contact time as indicated for disinfecting solutions.  ? ?

## 2021-12-04 NOTE — Assessment & Plan Note (Signed)
Rare use of eszopiclone 3 mg, number 60/year.  Continue as needed herbs. ?

## 2021-12-04 NOTE — Assessment & Plan Note (Signed)
Improved on d-mannose. Continue supplememt ?

## 2021-12-04 NOTE — Patient Instructions (Addendum)
Heartburn ?- Would recommend GI if these is persisting ?- Famotidine 20 mg Twice daily ?- If famotidine works well, take for up to 6 weeks then decrease to once daily then stop ?- if symptoms return immediately - should have you see GI ? ?Sleep ?- continue to work on Sleep hygiene ?- continue as needed sleep aides  ?

## 2021-12-04 NOTE — Assessment & Plan Note (Signed)
Discussed minimally decreased kidney function we will repeat labs today.  Blood pressure already under good control.  Advised against taking NSAIDs.  Advised checking her many supplements to see if any of these could hurt the kidneys.  In review of literature found that ? ?[place] herbals with aristocholic acid   ? ?Can impact kidney function we will send my chart to let patient know to avoid this herb. ?

## 2021-12-04 NOTE — Assessment & Plan Note (Signed)
Long discussion with patient about risk of this and benefits of treatment of ongoing acid reflux.  She will do trial of Pepcid 20 mg twice daily for up to 6 to 8 weeks.  If ineffective would recommend GI.  Of note she did not tolerate a PPI. ?

## 2022-02-05 ENCOUNTER — Telehealth: Payer: Self-pay | Admitting: Family Medicine

## 2022-02-05 NOTE — Telephone Encounter (Signed)
Patient declined the Medicare Wellness Visit with [facility]   ?Stated she does not need to complete the AWV  ?Do not call her back to try to schedule ?

## 2022-02-20 DIAGNOSIS — Z23 Encounter for immunization: Secondary | ICD-10-CM | POA: Diagnosis not present

## 2022-03-28 ENCOUNTER — Ambulatory Visit: Admitting: Dermatology

## 2022-04-16 DIAGNOSIS — H4423 Degenerative myopia, bilateral: Secondary | ICD-10-CM | POA: Diagnosis not present

## 2022-04-16 DIAGNOSIS — H2513 Age-related nuclear cataract, bilateral: Secondary | ICD-10-CM | POA: Diagnosis not present

## 2022-04-16 DIAGNOSIS — H02401 Unspecified ptosis of right eyelid: Secondary | ICD-10-CM | POA: Diagnosis not present

## 2022-04-24 ENCOUNTER — Telehealth: Payer: Self-pay | Admitting: Family Medicine

## 2022-04-24 NOTE — Telephone Encounter (Signed)
I have never seen patient for this. Please have her schedule with me or Dr. Lorelei Pont

## 2022-04-24 NOTE — Telephone Encounter (Signed)
Patient called and stated she is trying to get a referral for PT for sciatica. Lum Babe is the doctor, his number is [phone]. Call back number [phone].

## 2022-04-26 ENCOUNTER — Encounter: Payer: Self-pay | Admitting: Family Medicine

## 2022-04-26 ENCOUNTER — Ambulatory Visit (INDEPENDENT_AMBULATORY_CARE_PROVIDER_SITE_OTHER): Payer: Medicare Other | Admitting: Family Medicine

## 2022-04-26 VITALS — BP 120/62 | HR 74 | Temp 97.3°F | Wt 200.5 lb

## 2022-04-26 DIAGNOSIS — M79605 Pain in left leg: Secondary | ICD-10-CM | POA: Diagnosis not present

## 2022-04-26 DIAGNOSIS — M79604 Pain in right leg: Secondary | ICD-10-CM | POA: Diagnosis not present

## 2022-04-26 DIAGNOSIS — M25562 Pain in left knee: Secondary | ICD-10-CM

## 2022-04-26 DIAGNOSIS — M25561 Pain in right knee: Secondary | ICD-10-CM

## 2022-04-26 DIAGNOSIS — G8929 Other chronic pain: Secondary | ICD-10-CM | POA: Insufficient documentation

## 2022-04-26 DIAGNOSIS — K219 Gastro-esophageal reflux disease without esophagitis: Secondary | ICD-10-CM | POA: Diagnosis not present

## 2022-04-26 NOTE — Progress Notes (Addendum)
Subjective:     Shirley Bean is a 76 y.o. female presenting for Sciatica (Pain goes down both legs, but worse in L leg. Over the last few months. )     HPI  #Leg pain - had an injury in 2021 - started having pain the legs and buttock with exercising - now having pain with both the right and left side of the legs - no hx of back pain - will get knee pain during the day - will also get pain in the thigh - inner and out and in the calf - achy pain in the legs - worse when laying down at night - not sure if getting worse or just more away - avoiding yoga exercises that worsening  - has been walking  Knee pain - going up stairs - difficulty getting up from a chair - needs hands to get up and down from the ground  #Acid reflux - is doing better with small amounts of food every few hours - was trying the famotidine for 6 weeks but getting heart palpations  - did not tolerated PPI in the past  Review of Systems   Social History   Tobacco Use  Smoking Status Never  Smokeless Tobacco Never        Objective:    BP Readings from Last 3 Encounters:  04/26/22 120/62  12/04/21 122/70  01/12/21 124/60   Wt Readings from Last 3 Encounters:  04/26/22 200 lb 8 oz (90.9 kg)  12/04/21 197 lb 8 oz (89.6 kg)  01/12/21 202 lb 8 oz (91.9 kg)    BP 120/62   Pulse 74   Temp (!) 97.3 F (36.3 C) (Temporal)   Wt 200 lb 8 oz (90.9 kg)   SpO2 98%   BMI 32.36 kg/m    Physical Exam Constitutional:      General: She is not in acute distress.    Appearance: She is well-developed. She is not diaphoretic.  HENT:     Right Ear: External ear normal.     Left Ear: External ear normal.     Nose: Nose normal.  Eyes:     Conjunctiva/sclera: Conjunctivae normal.  Cardiovascular:     Rate and Rhythm: Normal rate.  Pulmonary:     Effort: Pulmonary effort is normal.  Musculoskeletal:     Cervical back: Neck supple.     Comments: Back Inspection: no  abnormalities Palpation: lumbar spinous process ttp, no Paraspinous ttp Strength: Normal bilateral hip flexion/extension, knee flexion/extension, dorsiflexion/extension Straight leg raise - negative b/l  Knee Left - lateral joint line ttp Right - no joint line ttp    Skin:    General: Skin is warm and dry.     Capillary Refill: Capillary refill takes less than 2 seconds.  Neurological:     Mental Status: She is alert. Mental status is at baseline.  Psychiatric:        Mood and Affect: Mood normal.        Behavior: Behavior normal.           Assessment & Plan:   Problem List Items Addressed This Visit       Digestive   GERD    Intolerant to PPI, side effects with Pepcid which she did take but did not notice resolution in her pain.  Some improvement with diet modification but still has persistent symptoms.  We will check H. pylori today, if negative will refer to [facility].  Relevant Orders   H. pylori breath test     Other   Bilateral leg pain    Suspect this may be some muscle related pain secondary to possible arthritis of the knee.  Her straight leg raise was negative and she lacks any back pain so do think sciatic pain is less likely especially with the description of her pain is achy.  Patient is interested in physical therapy and I do think this is an appropriate next step to get her back to her regular yoga routine pain-free.  Referral placed      Relevant Orders   Ambulatory referral to Physical Therapy   Chronic pain of both knees - Primary    She notes a history of an injury within the last year and was told she had bone spurs on the right knee.  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  Referral to physical therapy for strengthening.      Relevant Orders   Ambulatory referral to Physical Therapy   I spent 25 minutes with pt , obtaining history, examining, reviewing chart, documenting  encounter and discussing the above plan of care.    Return if symptoms worsen or fail to improve.  Lesleigh Noe, MD

## 2022-04-26 NOTE — Assessment & Plan Note (Addendum)
Intolerant to PPI, side effects with Pepcid which she did take but did not notice resolution in her pain.  Some improvement with diet modification but still has persistent symptoms.  We will check H. pylori today, if negative will refer to [facility].

## 2022-04-26 NOTE — Addendum Note (Signed)
Addended by: Lesleigh Noe on: 04/26/2022 02:36 PM   Modules accepted: Level of Service

## 2022-04-26 NOTE — Assessment & Plan Note (Signed)
She notes a history of an injury within the last year and was told she had bone spurs on the right knee.  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  Referral to physical therapy for strengthening.

## 2022-04-26 NOTE — Assessment & Plan Note (Signed)
Suspect this may be some muscle related pain secondary to possible arthritis of the knee.  Her straight leg raise was negative and she lacks any back pain so do think sciatic pain is less likely especially with the description of her pain is achy.  Patient is interested in physical therapy and I do think this is an appropriate next step to get her back to her regular yoga routine pain-free.  Referral placed

## 2022-04-26 NOTE — Patient Instructions (Signed)
#  Referral I have placed a referral to a specialist for you. You should receive a phone call from the specialty office. Make sure your voicemail is not full and that if you are able to answer your phone to unknown or new numbers.   It may take up to 2 weeks to hear about the referral. If you do not hear anything in 2 weeks, please call our office and ask to speak with the referral coordinator.   [facility] [address], [place] [address] [phone]  Breat test  - if negative plan for GI referral

## 2022-04-27 ENCOUNTER — Encounter (INDEPENDENT_AMBULATORY_CARE_PROVIDER_SITE_OTHER): Payer: Medicare Other | Admitting: Family Medicine

## 2022-04-27 ENCOUNTER — Other Ambulatory Visit: Payer: Medicare Other

## 2022-04-27 DIAGNOSIS — K529 Noninfective gastroenteritis and colitis, unspecified: Secondary | ICD-10-CM | POA: Diagnosis not present

## 2022-04-27 DIAGNOSIS — K219 Gastro-esophageal reflux disease without esophagitis: Secondary | ICD-10-CM

## 2022-04-28 ENCOUNTER — Encounter: Payer: Self-pay | Admitting: Family Medicine

## 2022-04-30 NOTE — Addendum Note (Signed)
Addended by: Waunita Schooner R on: 04/30/2022 02:02 PM   Modules accepted: Orders

## 2022-04-30 NOTE — Telephone Encounter (Signed)
Please see the MyChart message reply(ies) for my assessment and plan.  The patient gave consent for this Medical Advice Message and is aware that it may result in a bill to their insurance company as well as the possibility that this may result in a co-payment or deductible. They are an established patient, but are not seeking medical advice exclusively about a problem treated during an in person or video visit in the last 7 days. I did not recommend an in person or video visit within 7 days of my reply.  I spent a total of 7 minutes cumulative time as of 04/30/2022  Lesleigh Noe, MD

## 2022-05-01 NOTE — Telephone Encounter (Signed)
Additional 2 minutes on 05/01/2022

## 2022-05-02 LAB — H. PYLORI BREATH TEST: H. pylori Breath Test: NOT DETECTED

## 2022-05-04 ENCOUNTER — Other Ambulatory Visit: Payer: Self-pay | Admitting: Family Medicine

## 2022-05-04 ENCOUNTER — Other Ambulatory Visit (INDEPENDENT_AMBULATORY_CARE_PROVIDER_SITE_OTHER): Payer: Medicare Other

## 2022-05-04 DIAGNOSIS — K529 Noninfective gastroenteritis and colitis, unspecified: Secondary | ICD-10-CM | POA: Diagnosis not present

## 2022-05-04 DIAGNOSIS — G4709 Other insomnia: Secondary | ICD-10-CM

## 2022-05-04 NOTE — Telephone Encounter (Signed)
Refill request [email or web address] Last refill 12/04/21 #30/1 Last office visit 04/26/22

## 2022-05-07 DIAGNOSIS — K529 Noninfective gastroenteritis and colitis, unspecified: Secondary | ICD-10-CM | POA: Diagnosis not present

## 2022-05-07 NOTE — Addendum Note (Signed)
Addended by: Ellamae Sia on: 05/07/2022 03:41 PM   Modules accepted: Orders

## 2022-05-08 LAB — GIARDIA ANTIGEN
MICRO NUMBER:: 13744740
RESULT:: NOT DETECTED
SPECIMEN QUALITY:: ADEQUATE

## 2022-05-12 LAB — CELIAC PNL 2 RFLX ENDOMYSIAL AB TTR
(tTG) Ab, IgA: <1 U/mL
(tTG) Ab, IgG: <1 U/mL
Endomysial Ab IgA: NEGATIVE
Gliadin IgA: 1.5 U/mL
Gliadin IgG: <1 U/mL
Immunoglobulin A: 324 mg/dL — ABNORMAL HIGH (ref 70–320)

## 2022-05-12 LAB — CALPROTECTIN: Calprotectin: 13 mcg/g

## 2022-05-20 ENCOUNTER — Encounter: Payer: Self-pay | Admitting: Family Medicine

## 2022-05-21 ENCOUNTER — Ambulatory Visit: Payer: Medicare Other | Attending: Family Medicine | Admitting: Physical Therapy

## 2022-05-21 ENCOUNTER — Encounter: Payer: Self-pay | Admitting: Physical Therapy

## 2022-05-21 DIAGNOSIS — G8929 Other chronic pain: Secondary | ICD-10-CM | POA: Diagnosis not present

## 2022-05-21 DIAGNOSIS — M79605 Pain in left leg: Secondary | ICD-10-CM | POA: Diagnosis not present

## 2022-05-21 DIAGNOSIS — M6281 Muscle weakness (generalized): Secondary | ICD-10-CM | POA: Diagnosis not present

## 2022-05-21 DIAGNOSIS — M79604 Pain in right leg: Secondary | ICD-10-CM | POA: Insufficient documentation

## 2022-05-21 DIAGNOSIS — R262 Difficulty in walking, not elsewhere classified: Secondary | ICD-10-CM | POA: Diagnosis not present

## 2022-05-21 DIAGNOSIS — M25562 Pain in left knee: Secondary | ICD-10-CM | POA: Diagnosis not present

## 2022-05-21 DIAGNOSIS — M25561 Pain in right knee: Secondary | ICD-10-CM | POA: Diagnosis not present

## 2022-05-21 NOTE — Therapy (Signed)
OUTPATIENT PHYSICAL THERAPY LOWER EXTREMITY EVALUATION   Patient Name: Shirley Bean MRN: [id number] DOB:11-14-1945, 77 y.o., female Today's Date: 05/21/2022   PT End of Session - 05/21/22 1310     Visit Number 1    Date for PT Re-Evaluation 08/21/22    Authorization Type Medicare    PT Start Time 1308    PT Stop Time 9798    PT Time Calculation (min) 47 min    Activity Tolerance Patient tolerated treatment well    Behavior During Therapy [facility] for tasks assessed/performed             Past Medical History:  Diagnosis Date   Atypical mole 06/09/2019   Left Tip of Nose (solar lentigo atypical)   Cataract 11/07/2020   CKD (chronic kidney disease) stage 3, GFR 30-59 ml/min ([place]) 12/04/2021   GERD (gastroesophageal reflux disease)    GOITER 12/30/2007   Qualifier: Diagnosis of  By: Danny Lawless CMA, [place]     H/O thyroid nodule 08/29/2015   History of mononucleosis    Hx of scarlet fever    age 37   Hyperlipidemia, mixed 08/31/2015   Murmur 11/05/2019   Precordial chest pain 11/05/2019   Thyroid disease    Vitamin D deficiency 08/31/2015   Vitamin D deficiency 08/31/2015   Past Surgical History:  Procedure Laterality Date   THYROIDECTOMY, PARTIAL     Left   TUBAL LIGATION     Patient Active Problem List   Diagnosis Date Noted   Bilateral leg pain 04/26/2022   Chronic pain of both knees 04/26/2022   CKD (chronic kidney disease) stage 3, GFR 30-59 ml/min ([facility]) 12/04/2021   Dizziness of unknown etiology 01/12/2021   Left shoulder pain 11/07/2020   Photophobia of both eyes 11/07/2020   Cataract 11/07/2020   Urinary incontinence 11/07/2020   Rectocele 11/07/2020   Nocturia 11/07/2020   Murmur 11/05/2019   Nonspecific abnormal electrocardiogram (ECG) (EKG) 11/05/2019   Hyperlipidemia, mixed 08/31/2015   H/O thyroid nodule 08/29/2015   History of mononucleosis    GERD 12/30/2007   INSOMNIA 12/30/2007    PCP: Waunita Schooner  REFERRING PROVIDER: Waunita Schooner  REFERRING DIAG: bilateral leg pain  THERAPY DIAG:  Pain in left leg  Pain in right leg  Difficulty in walking, not elsewhere classified  Muscle weakness (generalized)  Rationale for Evaluation and Treatment Rehabilitation  ONSET DATE: 04/26/22  SUBJECTIVE:   SUBJECTIVE STATEMENT: Reports that she has had some knee and leg pain for months, she reports at times a catch in the knee, worse on the left, she reports that felt like it could be sciatica but the MD feels like it is in the knees  PERTINENT HISTORY: CKD, GERD  PAIN:  Are you having pain? Yes: NPRS scale: 2/10 Pain location: left knee > right Pain description: achey Aggravating factors: riding horse, stairs one at a time, extending the knee, getting up from sitting pain up to 10/10 Relieving factors: rest, don't move  PRECAUTIONS: None  WEIGHT BEARING RESTRICTIONS No  FALLS:  Has patient fallen in last 6 months? No  LIVING ENVIRONMENT: Lives with: lives with their family Lives in: House/apartment Stairs: Yes: Internal: 14 steps; can reach both Has following equipment at home: None  OCCUPATION: retired,   PLOF: Independent horseback riding, kayaking, swimming, walking, yoga, mows yard, pickleball  PATIENT GOALS less pain, stairs normally, feel stronger   OBJECTIVE:   DIAGNOSTIC FINDINGS: no imaging has been performed  PATIENT SURVEYS:  FOTO 35  COGNITION:  Overall cognitive status: Within functional limits for tasks assessed     SENSATION: WFL  MUSCLE LENGTH: Mild tightness in the HS and   POSTURE: rounded shoulders and forward head  PALPATION: Tender ITB, quads, has some crepitus in the left knee  LOWER EXTREMITY ROM: Knee ROM WFL's but very slow and painful with knee extension LOWER EXTREMITY MMT:  MMT Right eval Left eval  Hip flexion    Hip extension    Hip abduction    Hip adduction    Hip internal rotation    Hip external rotation    Knee flexion 4+ 4+  Knee  extension 4 4-  Ankle dorsiflexion    Ankle plantarflexion    Ankle inversion    Ankle eversion     (Blank rows = not tested)  FUNCTIONAL TESTS:  5 times sit to stand: 62 seconds To get up from sitting she leans her body forward over her thighs and then her legs are straight and she gets up very slowly  GAIT: Distance walked: 100 feet Assistive device utilized: None Level of assistance: Complete Independence Comments: very small shuffling steps, stairs are one at a time, due to pain and fear    TODAY'S TREATMENT: NuStep level 4 x 4 minutes Bike level 2 x 4 minutes Tape Ktape two strips to help decrease stress on the patella   PATIENT EDUCATION:  Education details: POC Person educated: Patient Education method: Explanation Education comprehension: verbalized understanding   HOME EXERCISE PROGRAM: Tape off in 3 days unless issues, no wall sits  ASSESSMENT:  CLINICAL IMPRESSION: Patient is a 76 y.o. female who was seen today for physical therapy evaluation and treatment for bilateral knee pain, left worse than the right, she has to lean forward in the chair and very slowly stand up, she has to use her hands to raise the left knee up onto the bed.  She has to do stairs one at a time, she is very healthy and does a lot of exercise and yardwork, but has a lot of difficulty with these small things.  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   REHAB POTENTIAL: Good  CLINICAL DECISION MAKING: Stable/uncomplicated  EVALUATION COMPLEXITY: Low   GOALS: Goals reviewed with patient? Yes  SHORT TERM GOALS: Target date 06/04/2022 Independent with initial HEP Goal status: INITIAL  LONG TERM GOALS:  Target date: 08/13/22  Independent with advanced HEP Goal status: INITIAL  2.  Understand proper posture and body mechanics Goal status: INITIAL  3.  Go up and down stairs step over step Goal status: INITIAL  4.  Be able to put leg up on bed without using hands Goal status: INITIAL  5.  Decrease pain 50% with getting up and down Goal status: INITIAL  PLAN: PT FREQUENCY: 1-2x/week  PT DURATION: 12 weeks  PLANNED INTERVENTIONS: Therapeutic exercises, Therapeutic activity, Neuromuscular re-education, Balance training, Gait training, Patient/Family education, Self Care, Joint mobilization, Dry Needling, Electrical stimulation, Spinal mobilization, Cryotherapy, Moist heat, Taping, Traction, Ultrasound, and Manual therapy  PLAN FOR NEXT SESSION: see how tape did, add easy exercises   Theophilus Walz W, PT 05/21/2022, 1:11 PM

## 2022-05-29 ENCOUNTER — Ambulatory Visit: Payer: Medicare Other | Admitting: Physical Therapy

## 2022-05-29 ENCOUNTER — Encounter: Payer: Self-pay | Admitting: Physical Therapy

## 2022-05-29 DIAGNOSIS — M79604 Pain in right leg: Secondary | ICD-10-CM

## 2022-05-29 DIAGNOSIS — R262 Difficulty in walking, not elsewhere classified: Secondary | ICD-10-CM | POA: Diagnosis not present

## 2022-05-29 DIAGNOSIS — M6281 Muscle weakness (generalized): Secondary | ICD-10-CM

## 2022-05-29 DIAGNOSIS — M79605 Pain in left leg: Secondary | ICD-10-CM | POA: Diagnosis not present

## 2022-05-29 DIAGNOSIS — M25561 Pain in right knee: Secondary | ICD-10-CM | POA: Diagnosis not present

## 2022-05-29 DIAGNOSIS — M25562 Pain in left knee: Secondary | ICD-10-CM | POA: Diagnosis not present

## 2022-05-29 NOTE — Therapy (Signed)
OUTPATIENT PHYSICAL THERAPY LOWER EXTREMITY TREATMENT  Patient Name: Shirley Bean MRN: [id number] DOB:1945/11/02, 76 y.o., female Today's Date: 05/29/2022   PT End of Session - 05/29/22 1142     Visit Number 2    Date for PT Re-Evaluation 08/21/22    Authorization Type Medicare    PT Start Time 8101    PT Stop Time 1230    PT Time Calculation (min) 45 min    Activity Tolerance Patient tolerated treatment well    Behavior During Therapy [facility] for tasks assessed/performed             Past Medical History:  Diagnosis Date   Atypical mole 06/09/2019   Left Tip of Nose (solar lentigo atypical)   Cataract 11/07/2020   CKD (chronic kidney disease) stage 3, GFR 30-59 ml/min ([place]) 12/04/2021   GERD (gastroesophageal reflux disease)    GOITER 12/30/2007   Qualifier: Diagnosis of  By: Danny Lawless CMA, [place]     H/O thyroid nodule 08/29/2015   History of mononucleosis    Hx of scarlet fever    age 68   Hyperlipidemia, mixed 08/31/2015   Murmur 11/05/2019   Precordial chest pain 11/05/2019   Thyroid disease    Vitamin D deficiency 08/31/2015   Vitamin D deficiency 08/31/2015   Past Surgical History:  Procedure Laterality Date   THYROIDECTOMY, PARTIAL     Left   TUBAL LIGATION     Patient Active Problem List   Diagnosis Date Noted   Bilateral leg pain 04/26/2022   Chronic pain of both knees 04/26/2022   CKD (chronic kidney disease) stage 3, GFR 30-59 ml/min ([facility]) 12/04/2021   Dizziness of unknown etiology 01/12/2021   Left shoulder pain 11/07/2020   Photophobia of both eyes 11/07/2020   Cataract 11/07/2020   Urinary incontinence 11/07/2020   Rectocele 11/07/2020   Nocturia 11/07/2020   Murmur 11/05/2019   Nonspecific abnormal electrocardiogram (ECG) (EKG) 11/05/2019   Hyperlipidemia, mixed 08/31/2015   H/O thyroid nodule 08/29/2015   History of mononucleosis    GERD 12/30/2007   INSOMNIA 12/30/2007    PCP: Waunita Schooner  REFERRING PROVIDER: Waunita Schooner  REFERRING DIAG: bilateral leg pain  THERAPY DIAG:  Pain in left leg  Pain in right leg  Muscle weakness (generalized)  Difficulty in walking, not elsewhere classified  Rationale for Evaluation and Treatment Rehabilitation  ONSET DATE: 04/26/22  SUBJECTIVE:   SUBJECTIVE STATEMENT: I am doing good, legs are the same, thought is was getting better after massage, bit is starting to come back.  PERTINENT HISTORY: CKD, GERD  PAIN:  Are you having pain? Yes: NPRS scale: 0/10 Pain location: left knee > right Pain description: achey Aggravating factors: riding horse, stairs one at a time, extending the knee, getting up from sitting pain up to 10/10 Relieving factors: rest, don't move  PRECAUTIONS: None  WEIGHT BEARING RESTRICTIONS No  FALLS:  Has patient fallen in last 6 months? No  LIVING ENVIRONMENT: Lives with: lives with their family Lives in: House/apartment Stairs: Yes: Internal: 14 steps; can reach both Has following equipment at home: None  OCCUPATION: retired,   PLOF: Independent horseback riding, kayaking, swimming, walking, yoga, mows yard, pickleball  PATIENT GOALS less pain, stairs normally, feel stronger   OBJECTIVE:   DIAGNOSTIC FINDINGS: no imaging has been performed  PATIENT SURVEYS:  FOTO 35    MUSCLE LENGTH: Mild tightness in the HS and   POSTURE: rounded shoulders and forward head  PALPATION: Tender ITB, quads, has some  crepitus in the left knee  LOWER EXTREMITY ROM: Knee ROM WFL's but very slow and painful with knee extension LOWER EXTREMITY MMT:  MMT Right eval Left eval  Hip flexion    Hip extension    Hip abduction    Hip adduction    Hip internal rotation    Hip external rotation    Knee flexion 4+ 4+  Knee extension 4 4-  Ankle dorsiflexion    Ankle plantarflexion    Ankle inversion    Ankle eversion     (Blank rows = not tested)  FUNCTIONAL TESTS:  5 times sit to stand: 62 seconds To get up from sitting  she leans her body forward over her thighs and then her legs are straight and she gets up very slowly  GAIT: Distance walked: 100 feet Assistive device utilized: None Level of assistance: Complete Independence Comments: very small shuffling steps, stairs are one at a time, due to pain and fear    TODAY'S TREATMENT: 05/29/22 Sit to stands x 5, 2x5 holding yellow ball  NuStep L5 x 6 min  Hamstring curls 25lb 2x10 Leg Ext 5lb 2x10 LAQ 2lb 2x10  Ball squeeze 2x10 Hip abd green 2x10 Heel raises black bar 2x10    NuStep level 4 x 4 minutes Bike level 2 x 4 minutes Tape Ktape two strips to help decrease stress on the patella   PATIENT EDUCATION:  Education details: POC Person educated: Patient Education method: Explanation Education comprehension: verbalized understanding   HOME EXERCISE PROGRAM: Tape off in 3 days unless issues, no wall sits  ASSESSMENT:  CLINICAL IMPRESSION: Pt enter doing well reporting temporary relief from a massage. Session focused on LE strengthening. Heavy anterior weight shift present to complete sit to stands. Som initial hesitation with leg extensions due to the anticipation of L knee pain. Cue for pacing needed throughout session to get pt to performed exercises slowly and focus on better muscle contraction    OBJECTIVE IMPAIRMENTS Abnormal gait, cardiopulmonary status limiting activity, decreased activity tolerance, decreased balance, decreased mobility, difficulty walking, decreased ROM, decreased strength, impaired flexibility, improper body mechanics, postural dysfunction, and pain.   REHAB POTENTIAL: Good  CLINICAL DECISION MAKING: Stable/uncomplicated  EVALUATION COMPLEXITY: Low   GOALS: Goals reviewed with patient? Yes  SHORT TERM GOALS: Target date 06/04/2022 Independent with initial HEP Goal status: INITIAL  LONG TERM GOALS: Target date: 08/13/22  Independent with advanced HEP Goal status: INITIAL  2.  Understand proper  posture and body mechanics Goal status: INITIAL  3.  Go up and down stairs step over step Goal status: INITIAL  4.  Be able to put leg up on bed without using hands Goal status: INITIAL  5.  Decrease pain 50% with getting up and down Goal status: INITIAL  PLAN: PT FREQUENCY: 1-2x/week  PT DURATION: 12 weeks  PLANNED INTERVENTIONS: Therapeutic exercises, Therapeutic activity, Neuromuscular re-education, Balance training, Gait training, Patient/Family education, Self Care, Joint mobilization, Dry Needling, Electrical stimulation, Spinal mobilization, Cryotherapy, Moist heat, Taping, Traction, Ultrasound, and Manual therapy  PLAN FOR NEXT SESSION: see how tape did, add easy exercises   Scot Jun, PTA 05/29/2022, 11:42 AM

## 2022-06-06 ENCOUNTER — Ambulatory Visit: Payer: Medicare Other | Attending: Family Medicine | Admitting: Physical Therapy

## 2022-06-06 ENCOUNTER — Encounter: Payer: Self-pay | Admitting: Physical Therapy

## 2022-06-06 DIAGNOSIS — R262 Difficulty in walking, not elsewhere classified: Secondary | ICD-10-CM | POA: Diagnosis not present

## 2022-06-06 DIAGNOSIS — M79604 Pain in right leg: Secondary | ICD-10-CM

## 2022-06-06 DIAGNOSIS — M79605 Pain in left leg: Secondary | ICD-10-CM

## 2022-06-06 DIAGNOSIS — M6281 Muscle weakness (generalized): Secondary | ICD-10-CM | POA: Diagnosis not present

## 2022-06-06 NOTE — Therapy (Signed)
OUTPATIENT PHYSICAL THERAPY LOWER EXTREMITY TREATMENT  Patient Name: Shirley Bean MRN: [id number] DOB:Jun 26, 1946, 76 y.o., female Today's Date: 06/06/2022   PT End of Session - 06/06/22 1612     Visit Number 3    Date for PT Re-Evaluation 08/21/22    Authorization Type Medicare    PT Start Time 1612    PT Stop Time 1700    PT Time Calculation (min) 48 min    Activity Tolerance Patient tolerated treatment well    Behavior During Therapy [facility] for tasks assessed/performed             Past Medical History:  Diagnosis Date   Atypical mole 06/09/2019   Left Tip of Nose (solar lentigo atypical)   Cataract 11/07/2020   CKD (chronic kidney disease) stage 3, GFR 30-59 ml/min ([facility]) 12/04/2021   GERD (gastroesophageal reflux disease)    GOITER 12/30/2007   Qualifier: Diagnosis of  By: Danny Lawless CMA, [place]     H/O thyroid nodule 08/29/2015   History of mononucleosis    Hx of scarlet fever    age 15   Hyperlipidemia, mixed 08/31/2015   Murmur 11/05/2019   Precordial chest pain 11/05/2019   Thyroid disease    Vitamin D deficiency 08/31/2015   Vitamin D deficiency 08/31/2015   Past Surgical History:  Procedure Laterality Date   THYROIDECTOMY, PARTIAL     Left   TUBAL LIGATION     Patient Active Problem List   Diagnosis Date Noted   Bilateral leg pain 04/26/2022   Chronic pain of both knees 04/26/2022   CKD (chronic kidney disease) stage 3, GFR 30-59 ml/min ([facility]) 12/04/2021   Dizziness of unknown etiology 01/12/2021   Left shoulder pain 11/07/2020   Photophobia of both eyes 11/07/2020   Cataract 11/07/2020   Urinary incontinence 11/07/2020   Rectocele 11/07/2020   Nocturia 11/07/2020   Murmur 11/05/2019   Nonspecific abnormal electrocardiogram (ECG) (EKG) 11/05/2019   Hyperlipidemia, mixed 08/31/2015   H/O thyroid nodule 08/29/2015   History of mononucleosis    GERD 12/30/2007   INSOMNIA 12/30/2007    PCP: Waunita Schooner  REFERRING PROVIDER: Waunita Schooner  REFERRING DIAG: bilateral leg pain  THERAPY DIAG:  Pain in left leg  Pain in right leg  Muscle weakness (generalized)  Difficulty in walking, not elsewhere classified  Rationale for Evaluation and Treatment Rehabilitation  ONSET DATE: 04/26/22  SUBJECTIVE:   SUBJECTIVE STATEMENT: I am doing better, moving legs better, less pain and was able to do yoga without much modification PERTINENT HISTORY: CKD, GERD  PAIN:  Are you having pain? Yes: NPRS scale: 0/10 Pain location: left knee > right Pain description: achey Aggravating factors: riding horse, stairs one at a time, extending the knee, getting up from sitting pain up to 10/10 Relieving factors: rest, don't move  PRECAUTIONS: None  WEIGHT BEARING RESTRICTIONS No  FALLS:  Has patient fallen in last 6 months? No  LIVING ENVIRONMENT: Lives with: lives with their family Lives in: House/apartment Stairs: Yes: Internal: 14 steps; can reach both Has following equipment at home: None  OCCUPATION: retired,   PLOF: Independent horseback riding, kayaking, swimming, walking, yoga, mows yard, pickleball  PATIENT GOALS less pain, stairs normally, feel stronger   OBJECTIVE:   DIAGNOSTIC FINDINGS: no imaging has been performed  PATIENT SURVEYS:  FOTO 35    MUSCLE LENGTH: Mild tightness in the HS and   POSTURE: rounded shoulders and forward head  PALPATION: Tender ITB, quads, has some crepitus in the left  knee  LOWER EXTREMITY ROM: Knee ROM WFL's but very slow and painful with knee extension LOWER EXTREMITY MMT:  MMT Right eval Left eval  Hip flexion    Hip extension    Hip abduction    Hip adduction    Hip internal rotation    Hip external rotation    Knee flexion 4+ 4+  Knee extension 4 4-  Ankle dorsiflexion    Ankle plantarflexion    Ankle inversion    Ankle eversion     (Blank rows = not tested)  FUNCTIONAL TESTS:  5 times sit to stand: 62 seconds To get up from sitting she leans her  body forward over her thighs and then her legs are straight and she gets up very slowly  GAIT: Distance walked: 100 feet Assistive device utilized: None Level of assistance: Complete Independence Comments: very small shuffling steps, stairs are one at a time, due to pain and fear    TODAY'S TREATMENT: 06/06/22 NuStep level 5 x 6 minutes LEg curls 25# 3x10 Leg extension 5# 3x10 Leg press 40# 2x10 Feet on ball K2C, trunk rotation, small bridges, isometric abs LE passive stretches     05/29/22 Sit to stands x 5, 2x5 holding yellow ball  NuStep L5 x 6 min  Hamstring curls 25lb 2x10 Leg Ext 5lb 2x10 LAQ 2lb 2x10  Ball squeeze 2x10 Hip abd green 2x10 Heel raises black bar 2x10    PATIENT EDUCATION:  Education details: POC Person educated: Patient Education method: Explanation Education comprehension: verbalized understanding   HOME EXERCISE PROGRAM: Tape off in 3 days unless issues, no wall sits  ASSESSMENT:  CLINICAL IMPRESSION: Patient feels like she can move better, less pain, able to do yoga and is able to lift leg up and down from bed and in and out of the car without using hands.  OBJECTIVE IMPAIRMENTS Abnormal gait, cardiopulmonary status limiting activity, decreased activity tolerance, decreased balance, decreased mobility, difficulty walking, decreased ROM, decreased strength, impaired flexibility, improper body mechanics, postural dysfunction, and pain.   REHAB POTENTIAL: Good  CLINICAL DECISION MAKING: Stable/uncomplicated  EVALUATION COMPLEXITY: Low   GOALS: Goals reviewed with patient? Yes  SHORT TERM GOALS: Target date 06/04/2022 Independent with initial HEP Goal status: met  LONG TERM GOALS: Target date: 08/13/22  Independent with advanced HEP Goal status: INITIAL  2.  Understand proper posture and body mechanics Goal status: INITIAL  3.  Go up and down stairs step over step Goal status: INITIAL  4.  Be able to put leg up on bed without  using hands Goal status: INITIAL  5.  Decrease pain 50% with getting up and down Goal status: INITIAL  PLAN: PT FREQUENCY: 1-2x/week  PT DURATION: 12 weeks  PLANNED INTERVENTIONS: Therapeutic exercises, Therapeutic activity, Neuromuscular re-education, Balance training, Gait training, Patient/Family education, Self Care, Joint mobilization, Dry Needling, Electrical stimulation, Spinal mobilization, Cryotherapy, Moist heat, Taping, Traction, Ultrasound, and Manual therapy  PLAN FOR NEXT SESSION: continue to work on strength and function   [employer], PT 06/06/2022, 4:13 PM

## 2022-06-14 ENCOUNTER — Ambulatory Visit: Payer: Medicare Other | Admitting: Physical Therapy

## 2022-06-18 ENCOUNTER — Encounter: Payer: Self-pay | Admitting: Physical Therapy

## 2022-06-18 ENCOUNTER — Ambulatory Visit: Payer: Medicare Other | Admitting: Physical Therapy

## 2022-06-18 DIAGNOSIS — M79604 Pain in right leg: Secondary | ICD-10-CM

## 2022-06-18 DIAGNOSIS — M6281 Muscle weakness (generalized): Secondary | ICD-10-CM

## 2022-06-18 DIAGNOSIS — R262 Difficulty in walking, not elsewhere classified: Secondary | ICD-10-CM | POA: Diagnosis not present

## 2022-06-18 DIAGNOSIS — M79605 Pain in left leg: Secondary | ICD-10-CM

## 2022-06-18 NOTE — Therapy (Signed)
OUTPATIENT PHYSICAL THERAPY LOWER EXTREMITY TREATMENT  PHYSICAL THERAPY DISCHARGE SUMMARY  Visits from Start of Care: 4  Current functional level related to goals / functional outcomes: Goals met   Remaining deficits: N/A   Education / Equipment: HEP   Patient agrees to discharge. Patient goals were met. Patient is being discharged due to meeting the stated rehab goals.   Patient Name: Shirley Bean MRN: [id number] DOB:1946-06-08, 76 y.o., female Today's Date: 06/18/2022   PT End of Session - 06/18/22 1305     Visit Number 4    Date for PT Re-Evaluation 08/21/22    Authorization Type Medicare    PT Start Time 1302    PT Stop Time 1345    PT Time Calculation (min) 43 min    Activity Tolerance Patient tolerated treatment well    Behavior During Therapy [facility] for tasks assessed/performed             Past Medical History:  Diagnosis Date   Atypical mole 06/09/2019   Left Tip of Nose (solar lentigo atypical)   Cataract 11/07/2020   CKD (chronic kidney disease) stage 3, GFR 30-59 ml/min (HCC) 12/04/2021   GERD (gastroesophageal reflux disease)    GOITER 12/30/2007   Qualifier: Diagnosis of  By: Danny Lawless CMA, [place]     H/O thyroid nodule 08/29/2015   History of mononucleosis    Hx of scarlet fever    age 50   Hyperlipidemia, mixed 08/31/2015   Murmur 11/05/2019   Precordial chest pain 11/05/2019   Thyroid disease    Vitamin D deficiency 08/31/2015   Vitamin D deficiency 08/31/2015   Past Surgical History:  Procedure Laterality Date   THYROIDECTOMY, PARTIAL     Left   TUBAL LIGATION     Patient Active Problem List   Diagnosis Date Noted   Bilateral leg pain 04/26/2022   Chronic pain of both knees 04/26/2022   CKD (chronic kidney disease) stage 3, GFR 30-59 ml/min (HCC) 12/04/2021   Dizziness of unknown etiology 01/12/2021   Left shoulder pain 11/07/2020   Photophobia of both eyes 11/07/2020   Cataract 11/07/2020   Urinary incontinence 11/07/2020    Rectocele 11/07/2020   Nocturia 11/07/2020   Murmur 11/05/2019   Nonspecific abnormal electrocardiogram (ECG) (EKG) 11/05/2019   Hyperlipidemia, mixed 08/31/2015   H/O thyroid nodule 08/29/2015   History of mononucleosis    GERD 12/30/2007   INSOMNIA 12/30/2007    PCP: Waunita Schooner  REFERRING PROVIDER: Waunita Schooner  REFERRING DIAG: bilateral leg pain  THERAPY DIAG:  Pain in left leg  Pain in right leg  Muscle weakness (generalized)  Difficulty in walking, not elsewhere classified  Rationale for Evaluation and Treatment Rehabilitation  ONSET DATE: 04/26/22  SUBJECTIVE:   SUBJECTIVE STATEMENT: "Totally healed" I can go up and down stairs without any pain. PERTINENT HISTORY: CKD, GERD  PAIN:  Are you having pain? Yes: NPRS scale: 0/10 Pain location: left knee > right Pain description: achey Aggravating factors: riding horse, stairs one at a time, extending the knee, getting up from sitting pain up to 10/10 Relieving factors: rest, don't move  PRECAUTIONS: None  WEIGHT BEARING RESTRICTIONS No  FALLS:  Has patient fallen in last 6 months? No  LIVING ENVIRONMENT: Lives with: lives with their family Lives in: House/apartment Stairs: Yes: Internal: 14 steps; can reach both Has following equipment at home: None  OCCUPATION: retired,   PLOF: Independent horseback riding, kayaking, swimming, walking, yoga, mows yard, pickleball  PATIENT GOALS less pain, stairs  normally, feel stronger   OBJECTIVE:   DIAGNOSTIC FINDINGS: no imaging has been performed  PATIENT SURVEYS:  FOTO 35    MUSCLE LENGTH: Mild tightness in the HS and   POSTURE: rounded shoulders and forward head  PALPATION: Tender ITB, quads, has some crepitus in the left knee  LOWER EXTREMITY ROM: Knee ROM WFL's but very slow and painful with knee extension LOWER EXTREMITY MMT:  MMT Right eval Left eval  Hip flexion    Hip extension    Hip abduction    Hip adduction    Hip internal  rotation    Hip external rotation    Knee flexion 4+ 4+  Knee extension 4 4-  Ankle dorsiflexion    Ankle plantarflexion    Ankle inversion    Ankle eversion     (Blank rows = not tested)  FUNCTIONAL TESTS:  5 times sit to stand: 62 seconds To get up from sitting she leans her body forward over her thighs and then her legs are straight and she gets up very slowly  GAIT: Distance walked: 100 feet Assistive device utilized: None Level of assistance: Complete Independence Comments: very small shuffling steps, stairs are one at a time, due to pain and fear    TODAY'S TREATMENT: 06/18/22 NuStep L5 X 6 min  Leg curls 25# 3x10 Leg extension 5# 3x10 Leg press 40# 2x10 Feet on ball K2C, trunk rotation, small bridges, isometric abs Ab Set 2x10 LE passive stretches   06/06/22 NuStep level 5 x 6 minutes LEg curls 25# 3x10 Leg extension 5# 3x10 Leg press 40# 2x10 Feet on ball K2C, trunk rotation, small bridges, isometric abs LE passive stretches     05/29/22 Sit to stands x 5, 2x5 holding yellow ball  NuStep L5 x 6 min  Hamstring curls 25lb 2x10 Leg Ext 5lb 2x10 LAQ 2lb 2x10  Ball squeeze 2x10 Hip abd green 2x10 Heel raises black bar 2x10    PATIENT EDUCATION:  Education details: POC Person educated: Patient Education method: Explanation Education comprehension: verbalized understanding   HOME EXERCISE PROGRAM: Tape off in 3 days unless issues, no wall sits  ASSESSMENT:  CLINICAL IMPRESSION: Pt enters doing well with no reports of pain. Pt reports no functional limitations and is pleased with her current functional status.  OBJECTIVE IMPAIRMENTS Abnormal gait, cardiopulmonary status limiting activity, decreased activity tolerance, decreased balance, decreased mobility, difficulty walking, decreased ROM, decreased strength, impaired flexibility, improper body mechanics, postural dysfunction, and pain.   REHAB POTENTIAL: Good  CLINICAL DECISION MAKING:  Stable/uncomplicated  EVALUATION COMPLEXITY: Low   GOALS: Goals reviewed with patient? Yes  SHORT TERM GOALS: Target date 06/04/2022 Independent with initial HEP Goal status: met  LONG TERM GOALS: Target date: 08/13/22  Independent with advanced HEP Goal status: Met  2.  Understand proper posture and body mechanics Goal status: Met  3.  Go up and down stairs step over step Goal status: Met  4.  Be able to put leg up on bed without using hands Goal status: Met  5.  Decrease pain 50% with getting up and down Goal status: Met  PLAN: PT FREQUENCY: 1-2x/week  PT DURATION: 12 weeks  PLANNED INTERVENTIONS: Therapeutic exercises, Therapeutic activity, Neuromuscular re-education, Balance training, Gait training, Patient/Family education, Self Care, Joint mobilization, Dry Needling, Electrical stimulation, Spinal mobilization, Cryotherapy, Moist heat, Taping, Traction, Ultrasound, and Manual therapy  PLAN FOR NEXT SESSION: D/C PT  PHYSICAL THERAPY DISCHARGE SUMMARY  Visits from Start of Care: 4  Patient agrees to  discharge. Patient goals were met. Patient is being discharged due to meeting the stated rehab goals.  Ethel Rana DPT 06/18/22 3:22 PM   Scot Jun, PTA 06/18/2022, 1:06 PM

## 2022-06-25 ENCOUNTER — Ambulatory Visit: Payer: Medicare Other | Admitting: Physical Therapy

## 2022-07-02 DIAGNOSIS — K219 Gastro-esophageal reflux disease without esophagitis: Secondary | ICD-10-CM | POA: Diagnosis not present

## 2022-08-07 DIAGNOSIS — Z23 Encounter for immunization: Secondary | ICD-10-CM | POA: Diagnosis not present

## 2022-09-18 DIAGNOSIS — Z23 Encounter for immunization: Secondary | ICD-10-CM | POA: Diagnosis not present

## 2022-09-20 DIAGNOSIS — H2513 Age-related nuclear cataract, bilateral: Secondary | ICD-10-CM | POA: Diagnosis not present

## 2022-09-20 DIAGNOSIS — H5310 Unspecified subjective visual disturbances: Secondary | ICD-10-CM | POA: Diagnosis not present

## 2022-09-20 DIAGNOSIS — H04123 Dry eye syndrome of bilateral lacrimal glands: Secondary | ICD-10-CM | POA: Diagnosis not present

## 2022-11-12 DIAGNOSIS — R42 Dizziness and giddiness: Secondary | ICD-10-CM | POA: Diagnosis not present

## 2022-11-15 DIAGNOSIS — R42 Dizziness and giddiness: Secondary | ICD-10-CM | POA: Diagnosis not present

## 2022-11-19 DIAGNOSIS — R42 Dizziness and giddiness: Secondary | ICD-10-CM | POA: Diagnosis not present

## 2022-11-22 DIAGNOSIS — R42 Dizziness and giddiness: Secondary | ICD-10-CM | POA: Diagnosis not present

## 2022-11-26 DIAGNOSIS — R42 Dizziness and giddiness: Secondary | ICD-10-CM | POA: Diagnosis not present

## 2022-11-29 DIAGNOSIS — R42 Dizziness and giddiness: Secondary | ICD-10-CM | POA: Diagnosis not present

## 2022-12-03 DIAGNOSIS — R42 Dizziness and giddiness: Secondary | ICD-10-CM | POA: Diagnosis not present

## 2022-12-06 DIAGNOSIS — R42 Dizziness and giddiness: Secondary | ICD-10-CM | POA: Diagnosis not present

## 2022-12-10 DIAGNOSIS — R42 Dizziness and giddiness: Secondary | ICD-10-CM | POA: Diagnosis not present

## 2022-12-13 DIAGNOSIS — Z1231 Encounter for screening mammogram for malignant neoplasm of breast: Secondary | ICD-10-CM | POA: Diagnosis not present

## 2022-12-13 LAB — HM MAMMOGRAPHY

## 2022-12-20 DIAGNOSIS — R42 Dizziness and giddiness: Secondary | ICD-10-CM | POA: Diagnosis not present

## 2022-12-24 DIAGNOSIS — R42 Dizziness and giddiness: Secondary | ICD-10-CM | POA: Diagnosis not present

## 2022-12-31 DIAGNOSIS — R42 Dizziness and giddiness: Secondary | ICD-10-CM | POA: Diagnosis not present

## 2023-01-02 DIAGNOSIS — R42 Dizziness and giddiness: Secondary | ICD-10-CM | POA: Diagnosis not present

## 2023-01-07 DIAGNOSIS — R42 Dizziness and giddiness: Secondary | ICD-10-CM | POA: Diagnosis not present

## 2023-01-10 DIAGNOSIS — R42 Dizziness and giddiness: Secondary | ICD-10-CM | POA: Diagnosis not present

## 2023-01-14 DIAGNOSIS — R42 Dizziness and giddiness: Secondary | ICD-10-CM | POA: Diagnosis not present

## 2023-01-17 DIAGNOSIS — R42 Dizziness and giddiness: Secondary | ICD-10-CM | POA: Diagnosis not present

## 2023-01-28 DIAGNOSIS — M9901 Segmental and somatic dysfunction of cervical region: Secondary | ICD-10-CM | POA: Diagnosis not present

## 2023-01-28 DIAGNOSIS — M9902 Segmental and somatic dysfunction of thoracic region: Secondary | ICD-10-CM | POA: Diagnosis not present

## 2023-01-28 DIAGNOSIS — R42 Dizziness and giddiness: Secondary | ICD-10-CM | POA: Diagnosis not present

## 2023-01-28 DIAGNOSIS — M9903 Segmental and somatic dysfunction of lumbar region: Secondary | ICD-10-CM | POA: Diagnosis not present

## 2023-01-30 DIAGNOSIS — R42 Dizziness and giddiness: Secondary | ICD-10-CM | POA: Diagnosis not present

## 2023-01-30 DIAGNOSIS — M9902 Segmental and somatic dysfunction of thoracic region: Secondary | ICD-10-CM | POA: Diagnosis not present

## 2023-01-30 DIAGNOSIS — M9903 Segmental and somatic dysfunction of lumbar region: Secondary | ICD-10-CM | POA: Diagnosis not present

## 2023-01-30 DIAGNOSIS — M9901 Segmental and somatic dysfunction of cervical region: Secondary | ICD-10-CM | POA: Diagnosis not present

## 2023-01-31 DIAGNOSIS — R42 Dizziness and giddiness: Secondary | ICD-10-CM | POA: Diagnosis not present

## 2023-02-01 DIAGNOSIS — M9903 Segmental and somatic dysfunction of lumbar region: Secondary | ICD-10-CM | POA: Diagnosis not present

## 2023-02-01 DIAGNOSIS — R42 Dizziness and giddiness: Secondary | ICD-10-CM | POA: Diagnosis not present

## 2023-02-01 DIAGNOSIS — M9901 Segmental and somatic dysfunction of cervical region: Secondary | ICD-10-CM | POA: Diagnosis not present

## 2023-02-01 DIAGNOSIS — M9902 Segmental and somatic dysfunction of thoracic region: Secondary | ICD-10-CM | POA: Diagnosis not present

## 2023-02-04 DIAGNOSIS — M9903 Segmental and somatic dysfunction of lumbar region: Secondary | ICD-10-CM | POA: Diagnosis not present

## 2023-02-04 DIAGNOSIS — M9901 Segmental and somatic dysfunction of cervical region: Secondary | ICD-10-CM | POA: Diagnosis not present

## 2023-02-04 DIAGNOSIS — R42 Dizziness and giddiness: Secondary | ICD-10-CM | POA: Diagnosis not present

## 2023-02-04 DIAGNOSIS — M9902 Segmental and somatic dysfunction of thoracic region: Secondary | ICD-10-CM | POA: Diagnosis not present

## 2023-02-07 DIAGNOSIS — R42 Dizziness and giddiness: Secondary | ICD-10-CM | POA: Diagnosis not present

## 2023-02-11 DIAGNOSIS — R42 Dizziness and giddiness: Secondary | ICD-10-CM | POA: Diagnosis not present

## 2023-02-13 DIAGNOSIS — R42 Dizziness and giddiness: Secondary | ICD-10-CM | POA: Diagnosis not present

## 2023-03-11 DIAGNOSIS — R42 Dizziness and giddiness: Secondary | ICD-10-CM | POA: Diagnosis not present

## 2023-03-11 DIAGNOSIS — M9901 Segmental and somatic dysfunction of cervical region: Secondary | ICD-10-CM | POA: Diagnosis not present

## 2023-03-11 DIAGNOSIS — M9902 Segmental and somatic dysfunction of thoracic region: Secondary | ICD-10-CM | POA: Diagnosis not present

## 2023-03-11 DIAGNOSIS — M9903 Segmental and somatic dysfunction of lumbar region: Secondary | ICD-10-CM | POA: Diagnosis not present

## 2023-03-13 DIAGNOSIS — R42 Dizziness and giddiness: Secondary | ICD-10-CM | POA: Diagnosis not present

## 2023-03-13 DIAGNOSIS — M9902 Segmental and somatic dysfunction of thoracic region: Secondary | ICD-10-CM | POA: Diagnosis not present

## 2023-03-13 DIAGNOSIS — M9903 Segmental and somatic dysfunction of lumbar region: Secondary | ICD-10-CM | POA: Diagnosis not present

## 2023-03-13 DIAGNOSIS — M9901 Segmental and somatic dysfunction of cervical region: Secondary | ICD-10-CM | POA: Diagnosis not present

## 2023-03-18 DIAGNOSIS — R42 Dizziness and giddiness: Secondary | ICD-10-CM | POA: Diagnosis not present

## 2023-03-18 DIAGNOSIS — M9903 Segmental and somatic dysfunction of lumbar region: Secondary | ICD-10-CM | POA: Diagnosis not present

## 2023-03-18 DIAGNOSIS — M9901 Segmental and somatic dysfunction of cervical region: Secondary | ICD-10-CM | POA: Diagnosis not present

## 2023-03-18 DIAGNOSIS — M9902 Segmental and somatic dysfunction of thoracic region: Secondary | ICD-10-CM | POA: Diagnosis not present

## 2023-03-19 DIAGNOSIS — R42 Dizziness and giddiness: Secondary | ICD-10-CM | POA: Diagnosis not present

## 2023-03-20 DIAGNOSIS — R42 Dizziness and giddiness: Secondary | ICD-10-CM | POA: Diagnosis not present

## 2023-03-20 DIAGNOSIS — M9903 Segmental and somatic dysfunction of lumbar region: Secondary | ICD-10-CM | POA: Diagnosis not present

## 2023-03-20 DIAGNOSIS — M9902 Segmental and somatic dysfunction of thoracic region: Secondary | ICD-10-CM | POA: Diagnosis not present

## 2023-03-20 DIAGNOSIS — M9901 Segmental and somatic dysfunction of cervical region: Secondary | ICD-10-CM | POA: Diagnosis not present

## 2023-03-21 DIAGNOSIS — R42 Dizziness and giddiness: Secondary | ICD-10-CM | POA: Diagnosis not present

## 2023-03-22 DIAGNOSIS — M9903 Segmental and somatic dysfunction of lumbar region: Secondary | ICD-10-CM | POA: Diagnosis not present

## 2023-03-22 DIAGNOSIS — M9902 Segmental and somatic dysfunction of thoracic region: Secondary | ICD-10-CM | POA: Diagnosis not present

## 2023-03-22 DIAGNOSIS — M9901 Segmental and somatic dysfunction of cervical region: Secondary | ICD-10-CM | POA: Diagnosis not present

## 2023-03-22 DIAGNOSIS — R42 Dizziness and giddiness: Secondary | ICD-10-CM | POA: Diagnosis not present

## 2023-03-25 DIAGNOSIS — R42 Dizziness and giddiness: Secondary | ICD-10-CM | POA: Diagnosis not present

## 2023-03-25 DIAGNOSIS — M9901 Segmental and somatic dysfunction of cervical region: Secondary | ICD-10-CM | POA: Diagnosis not present

## 2023-03-25 DIAGNOSIS — M9903 Segmental and somatic dysfunction of lumbar region: Secondary | ICD-10-CM | POA: Diagnosis not present

## 2023-03-25 DIAGNOSIS — M9902 Segmental and somatic dysfunction of thoracic region: Secondary | ICD-10-CM | POA: Diagnosis not present

## 2023-03-27 DIAGNOSIS — M9901 Segmental and somatic dysfunction of cervical region: Secondary | ICD-10-CM | POA: Diagnosis not present

## 2023-03-27 DIAGNOSIS — M9903 Segmental and somatic dysfunction of lumbar region: Secondary | ICD-10-CM | POA: Diagnosis not present

## 2023-03-27 DIAGNOSIS — R42 Dizziness and giddiness: Secondary | ICD-10-CM | POA: Diagnosis not present

## 2023-03-27 DIAGNOSIS — M9902 Segmental and somatic dysfunction of thoracic region: Secondary | ICD-10-CM | POA: Diagnosis not present

## 2023-03-29 DIAGNOSIS — M9902 Segmental and somatic dysfunction of thoracic region: Secondary | ICD-10-CM | POA: Diagnosis not present

## 2023-03-29 DIAGNOSIS — M9903 Segmental and somatic dysfunction of lumbar region: Secondary | ICD-10-CM | POA: Diagnosis not present

## 2023-03-29 DIAGNOSIS — M9901 Segmental and somatic dysfunction of cervical region: Secondary | ICD-10-CM | POA: Diagnosis not present

## 2023-03-29 DIAGNOSIS — R42 Dizziness and giddiness: Secondary | ICD-10-CM | POA: Diagnosis not present

## 2023-04-01 DIAGNOSIS — R42 Dizziness and giddiness: Secondary | ICD-10-CM | POA: Diagnosis not present

## 2023-04-01 DIAGNOSIS — M9903 Segmental and somatic dysfunction of lumbar region: Secondary | ICD-10-CM | POA: Diagnosis not present

## 2023-04-01 DIAGNOSIS — M9901 Segmental and somatic dysfunction of cervical region: Secondary | ICD-10-CM | POA: Diagnosis not present

## 2023-04-01 DIAGNOSIS — M9902 Segmental and somatic dysfunction of thoracic region: Secondary | ICD-10-CM | POA: Diagnosis not present

## 2023-04-02 DIAGNOSIS — N183 Chronic kidney disease, stage 3 unspecified: Secondary | ICD-10-CM | POA: Diagnosis not present

## 2023-04-02 DIAGNOSIS — K529 Noninfective gastroenteritis and colitis, unspecified: Secondary | ICD-10-CM | POA: Diagnosis not present

## 2023-04-02 DIAGNOSIS — R7301 Impaired fasting glucose: Secondary | ICD-10-CM | POA: Diagnosis not present

## 2023-04-02 DIAGNOSIS — K219 Gastro-esophageal reflux disease without esophagitis: Secondary | ICD-10-CM | POA: Diagnosis not present

## 2023-04-02 DIAGNOSIS — G4700 Insomnia, unspecified: Secondary | ICD-10-CM | POA: Diagnosis not present

## 2023-04-02 DIAGNOSIS — E785 Hyperlipidemia, unspecified: Secondary | ICD-10-CM | POA: Diagnosis not present

## 2023-04-02 DIAGNOSIS — R5383 Other fatigue: Secondary | ICD-10-CM | POA: Diagnosis not present

## 2023-04-03 DIAGNOSIS — M9902 Segmental and somatic dysfunction of thoracic region: Secondary | ICD-10-CM | POA: Diagnosis not present

## 2023-04-03 DIAGNOSIS — M9901 Segmental and somatic dysfunction of cervical region: Secondary | ICD-10-CM | POA: Diagnosis not present

## 2023-04-03 DIAGNOSIS — R42 Dizziness and giddiness: Secondary | ICD-10-CM | POA: Diagnosis not present

## 2023-04-03 DIAGNOSIS — M9903 Segmental and somatic dysfunction of lumbar region: Secondary | ICD-10-CM | POA: Diagnosis not present

## 2023-04-08 DIAGNOSIS — M9902 Segmental and somatic dysfunction of thoracic region: Secondary | ICD-10-CM | POA: Diagnosis not present

## 2023-04-08 DIAGNOSIS — M9903 Segmental and somatic dysfunction of lumbar region: Secondary | ICD-10-CM | POA: Diagnosis not present

## 2023-04-08 DIAGNOSIS — R42 Dizziness and giddiness: Secondary | ICD-10-CM | POA: Diagnosis not present

## 2023-04-08 DIAGNOSIS — M9901 Segmental and somatic dysfunction of cervical region: Secondary | ICD-10-CM | POA: Diagnosis not present

## 2023-04-15 DIAGNOSIS — M9901 Segmental and somatic dysfunction of cervical region: Secondary | ICD-10-CM | POA: Diagnosis not present

## 2023-04-15 DIAGNOSIS — M9903 Segmental and somatic dysfunction of lumbar region: Secondary | ICD-10-CM | POA: Diagnosis not present

## 2023-04-15 DIAGNOSIS — M9902 Segmental and somatic dysfunction of thoracic region: Secondary | ICD-10-CM | POA: Diagnosis not present

## 2023-04-15 DIAGNOSIS — R42 Dizziness and giddiness: Secondary | ICD-10-CM | POA: Diagnosis not present

## 2023-04-17 DIAGNOSIS — M9901 Segmental and somatic dysfunction of cervical region: Secondary | ICD-10-CM | POA: Diagnosis not present

## 2023-04-17 DIAGNOSIS — M9903 Segmental and somatic dysfunction of lumbar region: Secondary | ICD-10-CM | POA: Diagnosis not present

## 2023-04-17 DIAGNOSIS — R42 Dizziness and giddiness: Secondary | ICD-10-CM | POA: Diagnosis not present

## 2023-04-17 DIAGNOSIS — M9902 Segmental and somatic dysfunction of thoracic region: Secondary | ICD-10-CM | POA: Diagnosis not present

## 2023-04-22 DIAGNOSIS — H4423 Degenerative myopia, bilateral: Secondary | ICD-10-CM | POA: Diagnosis not present

## 2023-04-22 DIAGNOSIS — M9903 Segmental and somatic dysfunction of lumbar region: Secondary | ICD-10-CM | POA: Diagnosis not present

## 2023-04-22 DIAGNOSIS — H02401 Unspecified ptosis of right eyelid: Secondary | ICD-10-CM | POA: Diagnosis not present

## 2023-04-22 DIAGNOSIS — M9902 Segmental and somatic dysfunction of thoracic region: Secondary | ICD-10-CM | POA: Diagnosis not present

## 2023-04-22 DIAGNOSIS — R42 Dizziness and giddiness: Secondary | ICD-10-CM | POA: Diagnosis not present

## 2023-04-22 DIAGNOSIS — H04123 Dry eye syndrome of bilateral lacrimal glands: Secondary | ICD-10-CM | POA: Diagnosis not present

## 2023-04-22 DIAGNOSIS — H2513 Age-related nuclear cataract, bilateral: Secondary | ICD-10-CM | POA: Diagnosis not present

## 2023-04-22 DIAGNOSIS — M9901 Segmental and somatic dysfunction of cervical region: Secondary | ICD-10-CM | POA: Diagnosis not present

## 2023-04-24 DIAGNOSIS — M9902 Segmental and somatic dysfunction of thoracic region: Secondary | ICD-10-CM | POA: Diagnosis not present

## 2023-04-24 DIAGNOSIS — M9901 Segmental and somatic dysfunction of cervical region: Secondary | ICD-10-CM | POA: Diagnosis not present

## 2023-04-24 DIAGNOSIS — R42 Dizziness and giddiness: Secondary | ICD-10-CM | POA: Diagnosis not present

## 2023-04-24 DIAGNOSIS — M9903 Segmental and somatic dysfunction of lumbar region: Secondary | ICD-10-CM | POA: Diagnosis not present

## 2023-04-29 DIAGNOSIS — R42 Dizziness and giddiness: Secondary | ICD-10-CM | POA: Diagnosis not present

## 2023-04-29 DIAGNOSIS — M9903 Segmental and somatic dysfunction of lumbar region: Secondary | ICD-10-CM | POA: Diagnosis not present

## 2023-04-29 DIAGNOSIS — M9902 Segmental and somatic dysfunction of thoracic region: Secondary | ICD-10-CM | POA: Diagnosis not present

## 2023-04-29 DIAGNOSIS — M9901 Segmental and somatic dysfunction of cervical region: Secondary | ICD-10-CM | POA: Diagnosis not present

## 2023-04-30 DIAGNOSIS — L821 Other seborrheic keratosis: Secondary | ICD-10-CM | POA: Diagnosis not present

## 2023-04-30 DIAGNOSIS — D225 Melanocytic nevi of trunk: Secondary | ICD-10-CM | POA: Diagnosis not present

## 2023-04-30 DIAGNOSIS — L820 Inflamed seborrheic keratosis: Secondary | ICD-10-CM | POA: Diagnosis not present

## 2023-04-30 DIAGNOSIS — Z1283 Encounter for screening for malignant neoplasm of skin: Secondary | ICD-10-CM | POA: Diagnosis not present

## 2023-05-01 DIAGNOSIS — M9901 Segmental and somatic dysfunction of cervical region: Secondary | ICD-10-CM | POA: Diagnosis not present

## 2023-05-01 DIAGNOSIS — M9903 Segmental and somatic dysfunction of lumbar region: Secondary | ICD-10-CM | POA: Diagnosis not present

## 2023-05-01 DIAGNOSIS — M9902 Segmental and somatic dysfunction of thoracic region: Secondary | ICD-10-CM | POA: Diagnosis not present

## 2023-05-01 DIAGNOSIS — R42 Dizziness and giddiness: Secondary | ICD-10-CM | POA: Diagnosis not present

## 2023-05-06 DIAGNOSIS — M9901 Segmental and somatic dysfunction of cervical region: Secondary | ICD-10-CM | POA: Diagnosis not present

## 2023-05-06 DIAGNOSIS — M9903 Segmental and somatic dysfunction of lumbar region: Secondary | ICD-10-CM | POA: Diagnosis not present

## 2023-05-06 DIAGNOSIS — M9902 Segmental and somatic dysfunction of thoracic region: Secondary | ICD-10-CM | POA: Diagnosis not present

## 2023-05-06 DIAGNOSIS — R42 Dizziness and giddiness: Secondary | ICD-10-CM | POA: Diagnosis not present

## 2023-05-08 DIAGNOSIS — M9902 Segmental and somatic dysfunction of thoracic region: Secondary | ICD-10-CM | POA: Diagnosis not present

## 2023-05-08 DIAGNOSIS — R42 Dizziness and giddiness: Secondary | ICD-10-CM | POA: Diagnosis not present

## 2023-05-08 DIAGNOSIS — M9901 Segmental and somatic dysfunction of cervical region: Secondary | ICD-10-CM | POA: Diagnosis not present

## 2023-05-08 DIAGNOSIS — M9903 Segmental and somatic dysfunction of lumbar region: Secondary | ICD-10-CM | POA: Diagnosis not present

## 2023-05-13 DIAGNOSIS — M9902 Segmental and somatic dysfunction of thoracic region: Secondary | ICD-10-CM | POA: Diagnosis not present

## 2023-05-13 DIAGNOSIS — M9901 Segmental and somatic dysfunction of cervical region: Secondary | ICD-10-CM | POA: Diagnosis not present

## 2023-05-13 DIAGNOSIS — M9903 Segmental and somatic dysfunction of lumbar region: Secondary | ICD-10-CM | POA: Diagnosis not present

## 2023-05-13 DIAGNOSIS — R42 Dizziness and giddiness: Secondary | ICD-10-CM | POA: Diagnosis not present

## 2023-05-15 DIAGNOSIS — S61409A Unspecified open wound of unspecified hand, initial encounter: Secondary | ICD-10-CM | POA: Diagnosis not present

## 2023-05-20 DIAGNOSIS — R42 Dizziness and giddiness: Secondary | ICD-10-CM | POA: Diagnosis not present

## 2023-05-20 DIAGNOSIS — G4700 Insomnia, unspecified: Secondary | ICD-10-CM | POA: Diagnosis not present

## 2023-05-20 DIAGNOSIS — M9902 Segmental and somatic dysfunction of thoracic region: Secondary | ICD-10-CM | POA: Diagnosis not present

## 2023-05-20 DIAGNOSIS — M9901 Segmental and somatic dysfunction of cervical region: Secondary | ICD-10-CM | POA: Diagnosis not present

## 2023-05-20 DIAGNOSIS — M9903 Segmental and somatic dysfunction of lumbar region: Secondary | ICD-10-CM | POA: Diagnosis not present

## 2023-05-20 DIAGNOSIS — B349 Viral infection, unspecified: Secondary | ICD-10-CM | POA: Diagnosis not present

## 2023-05-22 DIAGNOSIS — M9902 Segmental and somatic dysfunction of thoracic region: Secondary | ICD-10-CM | POA: Diagnosis not present

## 2023-05-22 DIAGNOSIS — M9903 Segmental and somatic dysfunction of lumbar region: Secondary | ICD-10-CM | POA: Diagnosis not present

## 2023-05-22 DIAGNOSIS — R42 Dizziness and giddiness: Secondary | ICD-10-CM | POA: Diagnosis not present

## 2023-05-22 DIAGNOSIS — M9901 Segmental and somatic dysfunction of cervical region: Secondary | ICD-10-CM | POA: Diagnosis not present

## 2023-05-27 DIAGNOSIS — R42 Dizziness and giddiness: Secondary | ICD-10-CM | POA: Diagnosis not present

## 2023-05-27 DIAGNOSIS — M9902 Segmental and somatic dysfunction of thoracic region: Secondary | ICD-10-CM | POA: Diagnosis not present

## 2023-05-27 DIAGNOSIS — M9903 Segmental and somatic dysfunction of lumbar region: Secondary | ICD-10-CM | POA: Diagnosis not present

## 2023-05-27 DIAGNOSIS — M9901 Segmental and somatic dysfunction of cervical region: Secondary | ICD-10-CM | POA: Diagnosis not present

## 2023-05-29 DIAGNOSIS — M9902 Segmental and somatic dysfunction of thoracic region: Secondary | ICD-10-CM | POA: Diagnosis not present

## 2023-05-29 DIAGNOSIS — R42 Dizziness and giddiness: Secondary | ICD-10-CM | POA: Diagnosis not present

## 2023-05-29 DIAGNOSIS — M9901 Segmental and somatic dysfunction of cervical region: Secondary | ICD-10-CM | POA: Diagnosis not present

## 2023-05-29 DIAGNOSIS — M9903 Segmental and somatic dysfunction of lumbar region: Secondary | ICD-10-CM | POA: Diagnosis not present

## 2023-05-30 DIAGNOSIS — L2089 Other atopic dermatitis: Secondary | ICD-10-CM | POA: Diagnosis not present

## 2023-06-05 DIAGNOSIS — R42 Dizziness and giddiness: Secondary | ICD-10-CM | POA: Diagnosis not present

## 2023-06-05 DIAGNOSIS — M9903 Segmental and somatic dysfunction of lumbar region: Secondary | ICD-10-CM | POA: Diagnosis not present

## 2023-06-05 DIAGNOSIS — M9901 Segmental and somatic dysfunction of cervical region: Secondary | ICD-10-CM | POA: Diagnosis not present

## 2023-06-05 DIAGNOSIS — M9902 Segmental and somatic dysfunction of thoracic region: Secondary | ICD-10-CM | POA: Diagnosis not present

## 2023-06-06 DIAGNOSIS — R42 Dizziness and giddiness: Secondary | ICD-10-CM | POA: Diagnosis not present

## 2023-06-12 DIAGNOSIS — M9903 Segmental and somatic dysfunction of lumbar region: Secondary | ICD-10-CM | POA: Diagnosis not present

## 2023-06-12 DIAGNOSIS — M9901 Segmental and somatic dysfunction of cervical region: Secondary | ICD-10-CM | POA: Diagnosis not present

## 2023-06-12 DIAGNOSIS — R42 Dizziness and giddiness: Secondary | ICD-10-CM | POA: Diagnosis not present

## 2023-06-12 DIAGNOSIS — M9902 Segmental and somatic dysfunction of thoracic region: Secondary | ICD-10-CM | POA: Diagnosis not present

## 2023-06-17 DIAGNOSIS — R42 Dizziness and giddiness: Secondary | ICD-10-CM | POA: Diagnosis not present

## 2023-06-17 DIAGNOSIS — M9902 Segmental and somatic dysfunction of thoracic region: Secondary | ICD-10-CM | POA: Diagnosis not present

## 2023-06-17 DIAGNOSIS — M9901 Segmental and somatic dysfunction of cervical region: Secondary | ICD-10-CM | POA: Diagnosis not present

## 2023-06-17 DIAGNOSIS — M9903 Segmental and somatic dysfunction of lumbar region: Secondary | ICD-10-CM | POA: Diagnosis not present

## 2023-06-24 DIAGNOSIS — M9902 Segmental and somatic dysfunction of thoracic region: Secondary | ICD-10-CM | POA: Diagnosis not present

## 2023-06-24 DIAGNOSIS — M9901 Segmental and somatic dysfunction of cervical region: Secondary | ICD-10-CM | POA: Diagnosis not present

## 2023-06-24 DIAGNOSIS — M9903 Segmental and somatic dysfunction of lumbar region: Secondary | ICD-10-CM | POA: Diagnosis not present

## 2023-06-24 DIAGNOSIS — R42 Dizziness and giddiness: Secondary | ICD-10-CM | POA: Diagnosis not present

## 2023-07-01 DIAGNOSIS — R42 Dizziness and giddiness: Secondary | ICD-10-CM | POA: Diagnosis not present

## 2023-07-01 DIAGNOSIS — M9903 Segmental and somatic dysfunction of lumbar region: Secondary | ICD-10-CM | POA: Diagnosis not present

## 2023-07-01 DIAGNOSIS — M9902 Segmental and somatic dysfunction of thoracic region: Secondary | ICD-10-CM | POA: Diagnosis not present

## 2023-07-01 DIAGNOSIS — M9901 Segmental and somatic dysfunction of cervical region: Secondary | ICD-10-CM | POA: Diagnosis not present

## 2023-07-02 DIAGNOSIS — Z23 Encounter for immunization: Secondary | ICD-10-CM | POA: Diagnosis not present

## 2023-07-12 DIAGNOSIS — M9903 Segmental and somatic dysfunction of lumbar region: Secondary | ICD-10-CM | POA: Diagnosis not present

## 2023-07-12 DIAGNOSIS — M9901 Segmental and somatic dysfunction of cervical region: Secondary | ICD-10-CM | POA: Diagnosis not present

## 2023-07-12 DIAGNOSIS — R42 Dizziness and giddiness: Secondary | ICD-10-CM | POA: Diagnosis not present

## 2023-07-12 DIAGNOSIS — M9902 Segmental and somatic dysfunction of thoracic region: Secondary | ICD-10-CM | POA: Diagnosis not present

## 2023-07-25 ENCOUNTER — Other Ambulatory Visit: Payer: Self-pay | Admitting: Nurse Practitioner

## 2023-07-25 DIAGNOSIS — Z1231 Encounter for screening mammogram for malignant neoplasm of breast: Secondary | ICD-10-CM

## 2023-07-25 NOTE — Progress Notes (Signed)
Cardiology Office Note:   Date:  07/26/2023  ID:  Shirley Bean, DOB 1945/10/15, MRN [id number] PCP: Gweneth Dimitri, MD  [facility] Providers Cardiologist:  None {  History of Present Illness:   Shirley Bean is a 77 y.o. female who presents for evaluation of chest pain and palpitations.  She was also found to have a heart murmur. .  She had chest pain.  However, it was atypical and her coronary calcium score was zero.    She had a negative perfusion study in 2021.    I last saw her in 2021.    Since I last saw her she has been complaining of episodes of dizziness.  This has been a significant light sensitivity.  She would WEAR hats and sunglasses.  She had a workup to include electrolytes and thyroid and these were unremarkable.  She did wear a monitor.  I was able to review this today.  She had some sinus tachycardia but this was during active daytime hours.  There were few PVCs and ventricular couplets.  She had some bradycardia but when I reviewed this with her this would have all been during sleeping hours.  There were no sustained tacky arrhythmias.  There were no automatic arrhythmias.  There were no symptomatic bradycardias.  Symptoms she had did not correlate necessarily with any rhythm issues and often times she had some dizziness or palpitations during normal sinus rhythm.  She said that her symptoms have since improved.  She is no longer getting this as she stopped taking Lunesta which she thinks might have contributed.  She feels much better.   She returns for evaluation of discomfort.  She describes it as a tightness.  It seems to be like a vice grip on the tops of her arms and shoulders.  It goes under her left breast.  It has been happening for several weeks.  The worst episode was January 15.  She cannot remember how long it lasts.  When she gets these episodes she takes an aspirin and she lies down.  It can happen in the morning before she does anything.  It can  happen after yoga.  It is not reproducible particularly with activities.  She is not describing substernal chest pressure.  She may have had a little pain in her shoulders.  She has had some mild ankle swelling.  She tries walking but she has noticed that she has decreased exercise tolerance for walking.  She has tightness in her legs.  She is not had any resting shortness of breath, PND or orthopnea.  She had no cough fevers or chills   ROS: As stated in the HPI and negative for all other systems.  Studies Reviewed:    EKG:   EKG Interpretation Date/Time:  Friday July 26 2023 11:18:12 EDT Ventricular Rate:  64 PR Interval:  168 QRS Duration:  86 QT Interval:  394 QTC Calculation: 406 R Axis:   -36  Text Interpretation: Normal sinus rhythm with sinus arrhythmia Left axis deviation Poor anterior R wave progression Confirmed by Rollene Rotunda ([id number]) on 07/26/2023 12:32:38 PM     Risk Assessment/Calculations:              Physical Exam:   VS:  BP 132/74 (BP Location: Left Arm, Patient Position: Sitting, Cuff Size: Normal)   Pulse 78   Ht 5\' 6"  (1.676 m)   Wt 207 lb 9.6 oz (94.2 kg)   SpO2 95%   BMI 33.51  kg/m    Wt Readings from Last 3 Encounters:  07/26/23 207 lb 9.6 oz (94.2 kg)  04/26/22 200 lb 8 oz (90.9 kg)  12/04/21 197 lb 8 oz (89.6 kg)     GEN: Well nourished, well developed in no acute distress NECK: No JVD; No carotid bruits CARDIAC: RRR, no murmurs, rubs, gallops RESPIRATORY:  Clear to auscultation without rales, wheezing or rhonchi  ABDOMEN: Soft, non-tender, non-distended EXTREMITIES:  No edema; No deformity   ASSESSMENT AND PLAN:    DIZZINESS: This symptom has she said resolved essentially.  There were no significant dysrhythmias.  She was worried about the results of the monitor but we did review this today and as stated I see no pathologic arrhythmias or anything related.  I would not suggest further changes.  She is already had appropriate blood  work.  She will let me        Follow up with me as needed.   Signed, Rollene Rotunda, MD

## 2023-07-26 ENCOUNTER — Ambulatory Visit: Payer: Medicare Other | Attending: Cardiology | Admitting: Cardiology

## 2023-07-26 ENCOUNTER — Encounter: Payer: Self-pay | Admitting: Cardiology

## 2023-07-26 VITALS — BP 132/74 | HR 78 | Ht 66.0 in | Wt 207.6 lb

## 2023-07-26 DIAGNOSIS — R072 Precordial pain: Secondary | ICD-10-CM | POA: Diagnosis not present

## 2023-07-26 DIAGNOSIS — E785 Hyperlipidemia, unspecified: Secondary | ICD-10-CM | POA: Insufficient documentation

## 2023-07-26 NOTE — Patient Instructions (Signed)
  Follow-Up: At [facility], you and your health needs are our priority.  As part of our continuing mission to provide you with exceptional heart care, we have created designated Provider Care Teams.  These Care Teams include your primary Cardiologist (physician) and Advanced Practice Providers (APPs -  Physician Assistants and Nurse Practitioners) who all work together to provide you with the care you need, when you need it.  We recommend signing up for the patient portal called "MyChart".  Sign up information is provided on this After Visit Summary.  MyChart is used to connect with patients for Virtual Visits (Telemedicine).  Patients are able to view lab/test results, encounter notes, upcoming appointments, etc.  Non-urgent messages can be sent to your provider as well.   To learn more about what you can do with MyChart, go to [email or web address].    Your next appointment:    AS NEEDED   

## 2023-07-29 DIAGNOSIS — M9902 Segmental and somatic dysfunction of thoracic region: Secondary | ICD-10-CM | POA: Diagnosis not present

## 2023-07-29 DIAGNOSIS — M9901 Segmental and somatic dysfunction of cervical region: Secondary | ICD-10-CM | POA: Diagnosis not present

## 2023-07-29 DIAGNOSIS — R42 Dizziness and giddiness: Secondary | ICD-10-CM | POA: Diagnosis not present

## 2023-07-29 DIAGNOSIS — M9903 Segmental and somatic dysfunction of lumbar region: Secondary | ICD-10-CM | POA: Diagnosis not present

## 2023-08-05 DIAGNOSIS — M9905 Segmental and somatic dysfunction of pelvic region: Secondary | ICD-10-CM | POA: Diagnosis not present

## 2023-08-05 DIAGNOSIS — M545 Low back pain, unspecified: Secondary | ICD-10-CM | POA: Diagnosis not present

## 2023-08-05 DIAGNOSIS — M9901 Segmental and somatic dysfunction of cervical region: Secondary | ICD-10-CM | POA: Diagnosis not present

## 2023-08-05 DIAGNOSIS — M5032 Other cervical disc degeneration, mid-cervical region, unspecified level: Secondary | ICD-10-CM | POA: Diagnosis not present

## 2023-08-12 DIAGNOSIS — M5032 Other cervical disc degeneration, mid-cervical region, unspecified level: Secondary | ICD-10-CM | POA: Diagnosis not present

## 2023-08-12 DIAGNOSIS — M545 Low back pain, unspecified: Secondary | ICD-10-CM | POA: Diagnosis not present

## 2023-08-12 DIAGNOSIS — M9901 Segmental and somatic dysfunction of cervical region: Secondary | ICD-10-CM | POA: Diagnosis not present

## 2023-08-12 DIAGNOSIS — M9905 Segmental and somatic dysfunction of pelvic region: Secondary | ICD-10-CM | POA: Diagnosis not present

## 2023-08-20 DIAGNOSIS — Z23 Encounter for immunization: Secondary | ICD-10-CM | POA: Diagnosis not present

## 2023-08-21 DIAGNOSIS — M9901 Segmental and somatic dysfunction of cervical region: Secondary | ICD-10-CM | POA: Diagnosis not present

## 2023-08-21 DIAGNOSIS — M5032 Other cervical disc degeneration, mid-cervical region, unspecified level: Secondary | ICD-10-CM | POA: Diagnosis not present

## 2023-08-21 DIAGNOSIS — M9905 Segmental and somatic dysfunction of pelvic region: Secondary | ICD-10-CM | POA: Diagnosis not present

## 2023-08-21 DIAGNOSIS — M545 Low back pain, unspecified: Secondary | ICD-10-CM | POA: Diagnosis not present

## 2023-08-28 DIAGNOSIS — M9901 Segmental and somatic dysfunction of cervical region: Secondary | ICD-10-CM | POA: Diagnosis not present

## 2023-08-28 DIAGNOSIS — M9905 Segmental and somatic dysfunction of pelvic region: Secondary | ICD-10-CM | POA: Diagnosis not present

## 2023-08-28 DIAGNOSIS — M5032 Other cervical disc degeneration, mid-cervical region, unspecified level: Secondary | ICD-10-CM | POA: Diagnosis not present

## 2023-08-28 DIAGNOSIS — M545 Low back pain, unspecified: Secondary | ICD-10-CM | POA: Diagnosis not present

## 2023-09-05 DIAGNOSIS — M9901 Segmental and somatic dysfunction of cervical region: Secondary | ICD-10-CM | POA: Diagnosis not present

## 2023-09-05 DIAGNOSIS — M545 Low back pain, unspecified: Secondary | ICD-10-CM | POA: Diagnosis not present

## 2023-09-05 DIAGNOSIS — M9905 Segmental and somatic dysfunction of pelvic region: Secondary | ICD-10-CM | POA: Diagnosis not present

## 2023-09-05 DIAGNOSIS — M5032 Other cervical disc degeneration, mid-cervical region, unspecified level: Secondary | ICD-10-CM | POA: Diagnosis not present

## 2023-09-11 DIAGNOSIS — M9905 Segmental and somatic dysfunction of pelvic region: Secondary | ICD-10-CM | POA: Diagnosis not present

## 2023-09-11 DIAGNOSIS — M5032 Other cervical disc degeneration, mid-cervical region, unspecified level: Secondary | ICD-10-CM | POA: Diagnosis not present

## 2023-09-11 DIAGNOSIS — M9901 Segmental and somatic dysfunction of cervical region: Secondary | ICD-10-CM | POA: Diagnosis not present

## 2023-09-11 DIAGNOSIS — M545 Low back pain, unspecified: Secondary | ICD-10-CM | POA: Diagnosis not present

## 2023-09-23 DIAGNOSIS — M5032 Other cervical disc degeneration, mid-cervical region, unspecified level: Secondary | ICD-10-CM | POA: Diagnosis not present

## 2023-09-23 DIAGNOSIS — M9905 Segmental and somatic dysfunction of pelvic region: Secondary | ICD-10-CM | POA: Diagnosis not present

## 2023-09-23 DIAGNOSIS — M9901 Segmental and somatic dysfunction of cervical region: Secondary | ICD-10-CM | POA: Diagnosis not present

## 2023-09-23 DIAGNOSIS — M545 Low back pain, unspecified: Secondary | ICD-10-CM | POA: Diagnosis not present

## 2023-09-30 DIAGNOSIS — M9901 Segmental and somatic dysfunction of cervical region: Secondary | ICD-10-CM | POA: Diagnosis not present

## 2023-09-30 DIAGNOSIS — M5032 Other cervical disc degeneration, mid-cervical region, unspecified level: Secondary | ICD-10-CM | POA: Diagnosis not present

## 2023-09-30 DIAGNOSIS — M545 Low back pain, unspecified: Secondary | ICD-10-CM | POA: Diagnosis not present

## 2023-09-30 DIAGNOSIS — M9905 Segmental and somatic dysfunction of pelvic region: Secondary | ICD-10-CM | POA: Diagnosis not present

## 2023-12-16 ENCOUNTER — Ambulatory Visit
Admission: RE | Admit: 2023-12-16 | Discharge: 2023-12-16 | Disposition: A | Discharge status: Home / Self Care | Source: Ambulatory Visit | Attending: Nurse Practitioner | Admitting: Nurse Practitioner

## 2023-12-16 DIAGNOSIS — Z1231 Encounter for screening mammogram for malignant neoplasm of breast: Secondary | ICD-10-CM

## 2024-07-23 ENCOUNTER — Other Ambulatory Visit: Payer: Self-pay

## 2024-07-23 ENCOUNTER — Emergency Department (HOSPITAL_BASED_OUTPATIENT_CLINIC_OR_DEPARTMENT_OTHER)
Admission: EM | Admit: 2024-07-23 | Discharge: 2024-07-23 | Disposition: A | Discharge status: Home / Self Care | Attending: Emergency Medicine | Admitting: Emergency Medicine

## 2024-07-23 DIAGNOSIS — K047 Periapical abscess without sinus: Secondary | ICD-10-CM | POA: Diagnosis not present

## 2024-07-23 DIAGNOSIS — K0889 Other specified disorders of teeth and supporting structures: Secondary | ICD-10-CM | POA: Diagnosis present

## 2024-07-23 MED ORDER — SODIUM CHLORIDE 0.9 % IV SOLN
3.0000 g | Freq: Once | INTRAVENOUS | Status: AC
  Administered 2024-07-23: 3 g via INTRAVENOUS

## 2024-07-23 MED ORDER — DEXAMETHASONE SOD PHOSPHATE PF 10 MG/ML IJ SOLN
10.0000 mg | Freq: Once | INTRAMUSCULAR | Status: AC
Start: 2024-07-23 — End: 2024-07-23
  Administered 2024-07-23: 10 mg via INTRAVENOUS

## 2024-07-23 NOTE — ED Triage Notes (Signed)
 Abscessed tooth top right. Started new antibiotic today-not happy with progress. Scheduled for root canal. No systemic effects of infection noted. EDP at triage.

## 2024-07-23 NOTE — Discharge Instructions (Signed)
 Return if symptoms worsen as we discussed.  Follow-up with your dentist as scheduled.

## 2024-07-23 NOTE — ED Notes (Signed)
 Reviewed AVS/discharge instructions with patient. Time allotted for and all questions answered. Patient is agreeable for d/c and escorted to ED exit by staff.

## 2024-07-23 NOTE — ED Provider Notes (Signed)
 [place] EMERGENCY DEPARTMENT AT [facility] Provider Note   CSN: [id number] Arrival date & time: 07/23/24  1919     Patient presents with: Oral Swelling   Shirley Bean is a 78 y.o. female.   Patient here with ongoing pain to the right upper tooth.  Supposed to follow-up with dentist on Monday for root canal.  She was restarted on Augmentin today.  She denies any fever or chills.  No difficulty opening mouth.  She has history of celiac.  The history is provided by the patient.       Prior to Admission medications   Medication Sig Start Date End Date Taking? Authorizing Provider  ASHWAGANDHA PO Take 350 mg by mouth daily at 12 noon. 12/19/20   [provider]  Eszopiclone  3 MG TABS TAKE 1 TABLET BY MOUTH AT BEDTIME. TAKE IMMEDIATELY BEFORE BEDTIME 05/04/22   Velma Raisin, MD  famotidine  (PEPCID ) 20 MG tablet Take 1 tablet (20 mg total) by mouth 2 (two) times daily. 12/04/21   Velma Raisin, MD  Homeopathic Products (CANTHARIS COMPOSITUM PO) Take 200 mg by mouth. 05/15/14   [provider]  Licorice, Glycyrrhiza glabra, (LICORICE ROOT PO)  12/19/20   [provider]  MAGNESIUM PO Take by mouth as needed.    [provider]  NON FORMULARY aloe vera, pills and liquid 12/19/20   [provider]  NON FORMULARY H2 blocker 12/19/20   [provider]  Omega 3 1000 MG CAPS Take by mouth.    [provider]  OVER THE COUNTER MEDICATION     [provider]  OVER THE COUNTER MEDICATION     [provider]  OVER THE COUNTER MEDICATION     [provider]  OVER THE COUNTER MEDICATION 100 mg.    [provider]  Red Yeast Rice Extract (RED YEAST RICE PO) Take by mouth.    [provider]  TURMERIC PO Take by mouth daily.    [provider]  VITAMIN D PO Take by mouth.    [provider]    Allergies: Stadol [butorphanol], Codeine, and Sulfonamide derivatives     Review of Systems  Updated Vital Signs BP 104/64   Pulse 76   Temp 97.9 F (36.6 C) (Oral)   Resp 18   SpO2 99%   Physical Exam Vitals and nursing note reviewed.  Constitutional:      General: She is not in acute distress.    Appearance: She is well-developed. She is not ill-appearing.  HENT:     Head: Normocephalic and atraumatic.     Comments: Very faint fullness over the right cheek but I do not see any obvious swelling of the gums and dentition are overall well-appearing, there is no major swelling to the face or around the eye normal extraocular movements, no trismus or drooling no submandibular swelling    Nose: Nose normal.  Eyes:     Conjunctiva/sclera: Conjunctivae normal.  Cardiovascular:     Rate and Rhythm: Normal rate and regular rhythm.     Heart sounds: No murmur heard. Pulmonary:     Effort: Pulmonary effort is normal. No respiratory distress.     Breath sounds: Normal breath sounds.  Abdominal:     Palpations: Abdomen is soft.     Tenderness: There is no abdominal tenderness.  Musculoskeletal:        General: No swelling.     Cervical back: Normal range of motion and neck supple.  Skin:    General: Skin is warm and dry.     Capillary Refill: Capillary refill takes less than 2 seconds.  Neurological:     Mental Status: She is alert.  Psychiatric:        Mood and Affect: Mood normal.     (all labs ordered are listed, but only abnormal results are displayed) Labs Reviewed - No data to display  EKG: None  Radiology: No results found.   Procedures   Medications Ordered in the ED  Ampicillin-Sulbactam (UNASYN) 3 g in sodium chloride 0.9 % 100 mL IVPB (3 g Intravenous New Bag/Given 07/23/24 1956)  dexamethasone (DECADRON) injection 10 mg (10 mg Intravenous Given 07/23/24 1957)                                    Medical Decision Making Risk Prescription drug management.   Shirley Bean is here with dental pain.  Normal vitals.  No  fever.  History of celiac.  She got very minimal swelling to the right cheekbone has known possible small dental abscess supposed have a root canal next week for this.  She is on Augmentin that started today.  I do not see any major swelling involving the eye or the nose.  Do not see any major swelling of the gumline.  There is no trismus there is no drooling.  Will give her a dose of Unasyn and Decadron and have her follow-up with her dentist which is scheduled for next week.  She understands return precautions.  Does not have any systemic symptoms.  Overall I do think that this is a localized process.  She understands return precautions.  Discharge.  This chart was dictated using voice recognition software.  Despite best efforts to proofread,  errors can occur which can change the documentation meaning.      Final diagnoses:  Dental infection    ED Discharge Orders     None          Ruthe Cornet, DO 07/23/24 2028
